# Patient Record
Sex: Male | Born: 1950
Health system: Southern US, Community
[De-identification: ages and names within clinical notes are randomized; demographics above are authoritative.]

## PROBLEM LIST (undated history)

## (undated) DIAGNOSIS — M5430 Sciatica, unspecified side: Secondary | ICD-10-CM

## (undated) DIAGNOSIS — I10 Essential (primary) hypertension: Secondary | ICD-10-CM

## (undated) DIAGNOSIS — C9110 Chronic lymphocytic leukemia of B-cell type not having achieved remission: Secondary | ICD-10-CM

## (undated) HISTORY — PX: TONSILLECTOMY: SUR1361

## (undated) HISTORY — DX: Chronic lymphocytic leukemia of B-cell type not having achieved remission: C91.10

## (undated) HISTORY — DX: Sciatica, unspecified side: M54.30

## (undated) HISTORY — DX: Essential (primary) hypertension: I10

---

## 2010-07-26 ENCOUNTER — Other Ambulatory Visit
Admission: RE | Admit: 2010-07-26 | Discharge: 2010-07-26 | Payer: Self-pay | Source: Home / Self Care | Admitting: Oncology

## 2014-07-26 ENCOUNTER — Other Ambulatory Visit (HOSPITAL_COMMUNITY)
Admission: RE | Admit: 2014-07-26 | Discharge: 2014-07-26 | Disposition: A | Discharge status: Home / Self Care | Payer: Medicare Other | Source: Ambulatory Visit | Attending: Oncology | Admitting: Oncology

## 2014-07-26 DIAGNOSIS — C61 Malignant neoplasm of prostate: Secondary | ICD-10-CM | POA: Insufficient documentation

## 2014-08-03 LAB — TISSUE HYBRIDIZATION TO NCBH

## 2015-07-21 DIAGNOSIS — C9110 Chronic lymphocytic leukemia of B-cell type not having achieved remission: Secondary | ICD-10-CM | POA: Diagnosis not present

## 2015-11-06 ENCOUNTER — Other Ambulatory Visit: Payer: Self-pay | Admitting: Orthopaedic Surgery

## 2015-11-06 DIAGNOSIS — M5416 Radiculopathy, lumbar region: Secondary | ICD-10-CM

## 2015-11-09 ENCOUNTER — Ambulatory Visit
Admission: RE | Admit: 2015-11-09 | Discharge: 2015-11-09 | Disposition: A | Discharge status: Home / Self Care | Payer: BLUE CROSS/BLUE SHIELD | Source: Ambulatory Visit | Attending: Orthopaedic Surgery | Admitting: Orthopaedic Surgery

## 2015-11-09 DIAGNOSIS — M479 Spondylosis, unspecified: Secondary | ICD-10-CM | POA: Insufficient documentation

## 2015-11-09 DIAGNOSIS — M5416 Radiculopathy, lumbar region: Secondary | ICD-10-CM | POA: Insufficient documentation

## 2015-11-09 DIAGNOSIS — M5136 Other intervertebral disc degeneration, lumbar region: Secondary | ICD-10-CM | POA: Diagnosis not present

## 2015-12-06 DIAGNOSIS — C9110 Chronic lymphocytic leukemia of B-cell type not having achieved remission: Secondary | ICD-10-CM | POA: Diagnosis not present

## 2016-04-18 DIAGNOSIS — C9110 Chronic lymphocytic leukemia of B-cell type not having achieved remission: Secondary | ICD-10-CM | POA: Diagnosis not present

## 2016-08-19 DIAGNOSIS — C9110 Chronic lymphocytic leukemia of B-cell type not having achieved remission: Secondary | ICD-10-CM | POA: Diagnosis not present

## 2016-08-19 DIAGNOSIS — D509 Iron deficiency anemia, unspecified: Secondary | ICD-10-CM | POA: Diagnosis not present

## 2016-12-17 DIAGNOSIS — C9110 Chronic lymphocytic leukemia of B-cell type not having achieved remission: Secondary | ICD-10-CM | POA: Diagnosis not present

## 2017-04-17 DIAGNOSIS — C9110 Chronic lymphocytic leukemia of B-cell type not having achieved remission: Secondary | ICD-10-CM | POA: Diagnosis not present

## 2017-08-19 DIAGNOSIS — C9110 Chronic lymphocytic leukemia of B-cell type not having achieved remission: Secondary | ICD-10-CM | POA: Diagnosis not present

## 2017-08-19 DIAGNOSIS — D649 Anemia, unspecified: Secondary | ICD-10-CM | POA: Diagnosis not present

## 2018-04-01 DIAGNOSIS — M545 Low back pain: Secondary | ICD-10-CM | POA: Diagnosis not present

## 2018-04-01 DIAGNOSIS — M48061 Spinal stenosis, lumbar region without neurogenic claudication: Secondary | ICD-10-CM | POA: Diagnosis not present

## 2019-03-25 DIAGNOSIS — M545 Low back pain: Secondary | ICD-10-CM | POA: Diagnosis not present

## 2019-03-25 DIAGNOSIS — M48061 Spinal stenosis, lumbar region without neurogenic claudication: Secondary | ICD-10-CM | POA: Diagnosis not present

## 2019-12-08 DIAGNOSIS — M48061 Spinal stenosis, lumbar region without neurogenic claudication: Secondary | ICD-10-CM | POA: Diagnosis not present

## 2019-12-08 DIAGNOSIS — M545 Low back pain: Secondary | ICD-10-CM | POA: Diagnosis not present

## 2020-06-05 DIAGNOSIS — D485 Neoplasm of uncertain behavior of skin: Secondary | ICD-10-CM | POA: Diagnosis not present

## 2020-06-06 DIAGNOSIS — D044 Carcinoma in situ of skin of scalp and neck: Secondary | ICD-10-CM | POA: Diagnosis not present

## 2020-06-06 DIAGNOSIS — L820 Inflamed seborrheic keratosis: Secondary | ICD-10-CM | POA: Diagnosis not present

## 2020-06-06 DIAGNOSIS — C44602 Unspecified malignant neoplasm of skin of right upper limb, including shoulder: Secondary | ICD-10-CM | POA: Diagnosis not present

## 2020-06-15 ENCOUNTER — Telehealth: Payer: Self-pay

## 2020-06-15 NOTE — Telephone Encounter (Signed)
Pt called to ask for an appt with Dr Bobby Rumpf. He states that he has recently had a biopsy done on skin lesion on his back. Dermatologist told him they would like for him to see an oncologist and get PET ordered.

## 2020-06-16 ENCOUNTER — Inpatient Hospital Stay: Payer: Medicare HMO | Attending: Internal Medicine | Admitting: Internal Medicine

## 2020-06-16 ENCOUNTER — Other Ambulatory Visit: Payer: Self-pay

## 2020-06-16 ENCOUNTER — Inpatient Hospital Stay: Payer: Medicare HMO

## 2020-06-16 ENCOUNTER — Encounter (INDEPENDENT_AMBULATORY_CARE_PROVIDER_SITE_OTHER): Payer: Self-pay

## 2020-06-16 ENCOUNTER — Encounter: Payer: Self-pay | Admitting: Internal Medicine

## 2020-06-16 DIAGNOSIS — D044 Carcinoma in situ of skin of scalp and neck: Secondary | ICD-10-CM | POA: Diagnosis not present

## 2020-06-16 DIAGNOSIS — Z800 Family history of malignant neoplasm of digestive organs: Secondary | ICD-10-CM | POA: Insufficient documentation

## 2020-06-16 DIAGNOSIS — C801 Malignant (primary) neoplasm, unspecified: Secondary | ICD-10-CM

## 2020-06-16 DIAGNOSIS — Z8042 Family history of malignant neoplasm of prostate: Secondary | ICD-10-CM | POA: Insufficient documentation

## 2020-06-16 DIAGNOSIS — C9111 Chronic lymphocytic leukemia of B-cell type in remission: Secondary | ICD-10-CM

## 2020-06-16 DIAGNOSIS — R7989 Other specified abnormal findings of blood chemistry: Secondary | ICD-10-CM | POA: Insufficient documentation

## 2020-06-16 DIAGNOSIS — C792 Secondary malignant neoplasm of skin: Secondary | ICD-10-CM | POA: Diagnosis not present

## 2020-06-16 DIAGNOSIS — R590 Localized enlarged lymph nodes: Secondary | ICD-10-CM | POA: Insufficient documentation

## 2020-06-16 DIAGNOSIS — Z803 Family history of malignant neoplasm of breast: Secondary | ICD-10-CM | POA: Insufficient documentation

## 2020-06-16 DIAGNOSIS — L218 Other seborrheic dermatitis: Secondary | ICD-10-CM | POA: Diagnosis not present

## 2020-06-16 DIAGNOSIS — I10 Essential (primary) hypertension: Secondary | ICD-10-CM | POA: Insufficient documentation

## 2020-06-16 LAB — COMPREHENSIVE METABOLIC PANEL
ALT: 11 U/L (ref 0–44)
AST: 22 U/L (ref 15–41)
Albumin: 4.3 g/dL (ref 3.5–5.0)
Alkaline Phosphatase: 62 U/L (ref 38–126)
Anion gap: 9 (ref 5–15)
BUN: 16 mg/dL (ref 8–23)
CO2: 28 mmol/L (ref 22–32)
Calcium: 9 mg/dL (ref 8.9–10.3)
Chloride: 98 mmol/L (ref 98–111)
Creatinine, Ser: 1.14 mg/dL (ref 0.61–1.24)
GFR, Estimated: >60 mL/min (ref >60)
Glucose, Bld: 103 mg/dL — ABNORMAL HIGH (ref 70–99)
Potassium: 4.9 mmol/L (ref 3.5–5.1)
Sodium: 135 mmol/L (ref 135–145)
Total Bilirubin: 0.9 mg/dL (ref 0.3–1.2)
Total Protein: 6.3 g/dL — ABNORMAL LOW (ref 6.5–8.1)

## 2020-06-16 LAB — CBC WITH DIFFERENTIAL/PLATELET
Abs Immature Granulocytes: 0.28 10*3/uL — ABNORMAL HIGH (ref 0.00–0.07)
Basophils Absolute: 0.2 10*3/uL — ABNORMAL HIGH (ref 0.0–0.1)
Basophils Relative: 0 %
Eosinophils Absolute: 0.3 10*3/uL (ref 0.0–0.5)
Eosinophils Relative: 0 %
HCT: 34.9 % — ABNORMAL LOW (ref 39.0–52.0)
Hemoglobin: 10.6 g/dL — ABNORMAL LOW (ref 13.0–17.0)
Immature Granulocytes: 0 %
Lymphocytes Relative: 95 %
Lymphs Abs: 136.5 10*3/uL — ABNORMAL HIGH (ref 0.7–4.0)
MCH: 28.6 pg (ref 26.0–34.0)
MCHC: 30.4 g/dL (ref 30.0–36.0)
MCV: 94.1 fL (ref 80.0–100.0)
Monocytes Absolute: 3.4 10*3/uL — ABNORMAL HIGH (ref 0.1–1.0)
Monocytes Relative: 2 %
Neutro Abs: 4.8 10*3/uL (ref 1.7–7.7)
Neutrophils Relative %: 3 %
Platelets: 116 10*3/uL — ABNORMAL LOW (ref 150–400)
RBC: 3.71 MIL/uL — ABNORMAL LOW (ref 4.22–5.81)
RDW: 14.5 % (ref 11.5–15.5)
Smear Review: DECREASED
WBC: 145.3 10*3/uL (ref 4.0–10.5)
nRBC: 0 % (ref 0.0–0.2)

## 2020-06-16 LAB — PSA: Prostatic Specific Antigen: 1.24 ng/mL (ref 0.00–4.00)

## 2020-06-16 LAB — APTT: aPTT: 27 seconds (ref 24–36)

## 2020-06-16 LAB — PROTIME-INR
INR: 1.1 (ref 0.8–1.2)
Prothrombin Time: 13.8 seconds (ref 11.4–15.2)

## 2020-06-16 LAB — HEPATITIS B SURFACE ANTIGEN: Hepatitis B Surface Ag: NONREACTIVE

## 2020-06-16 LAB — LACTATE DEHYDROGENASE: LDH: 133 U/L (ref 98–192)

## 2020-06-16 NOTE — Progress Notes (Signed)
[place] NOTE  Patient Care Team: Helen Hashimoto., MD as PCP - General (Internal Medicine)  CHIEF COMPLAINTS/PURPOSE OF CONSULTATION: carcinoma of right posterior shoulder  #  Oncology History Overview Note  # DEC 2021-RIGHT POSTERIOR SHOULDER- Bx [Dr.Dasher]- POORLY DIFFERENTIATED CARCINOMA of UNKNOWN PRIMARY  #DEC 2021-Scalp-skin biopsy squamous cell carcinoma in situ; incomplete resection-  # CLL [2011; Dr.Lewis; [place]]- Surveillaince  # daughter [name]; breast cancer]; seborrheic dermatitis.  # # NGS/MOLECULAR TESTS:    # PALLIATIVE CARE EVALUATION:  # PAIN MANAGEMENT:    DIAGNOSIS:   STAGE:         ;  GOALS:  CURRENT/MOST RECENT THERAPY :     Provisional carcinoma of unknown primary site ([place])  06/16/2020 Initial Diagnosis   Provisional carcinoma of unknown primary site [facility])      HISTORY OF PRESENTING ILLNESS:  Brian Cabrera 69 y.o.  male history of CLL diagnosed approximate 10 years ago-currently on surveillance has been referred to [place] for new diagnosis of carcinoma of his right shoulder.  Patient states that he noted to have a lump right posterior shoulder approximately 6 to 8 months ago.  Slowly getting bigger in size.  Noted to have intermittent bleeding especially with taking of the bandage.  No significant pain.  He finally ended up seeing dermatology few days ago when he had a biopsy of the lesion; along with biopsy of 2 other areas on his scalp.   With regards to CLL patient has lymphadenopathy in his neck underarms-which does not seem to be getting worsening.   Patient denies any weight loss.  Denies any night sweats.  Denies any chest pain or shortness of breath or cough.  No bone pain.   Review of Systems  Constitutional: Negative for chills, diaphoresis, fever, malaise/fatigue and weight loss.  HENT: Negative for nosebleeds and sore throat.   Eyes: Negative for double vision.  Respiratory: Negative for  cough, hemoptysis, sputum production, shortness of breath and wheezing.   Cardiovascular: Negative for chest pain, palpitations, orthopnea and leg swelling.  Gastrointestinal: Negative for abdominal pain, blood in stool, constipation, diarrhea, heartburn, melena, nausea and vomiting.  Genitourinary: Negative for dysuria, frequency and urgency.  Musculoskeletal: Negative for back pain and joint pain.  Skin: Negative.  Negative for itching and rash.  Neurological: Negative for dizziness, tingling, focal weakness, weakness and headaches.  Endo/Heme/Allergies: Does not bruise/bleed easily.  Psychiatric/Behavioral: Negative for depression. The patient is not nervous/anxious and does not have insomnia.      MEDICAL HISTORY:  Past Medical History:  Diagnosis Date  . CLL (chronic lymphocytic leukemia) ([place])   . Hypertension   . Sciatic leg pain     SURGICAL HISTORY: Past Surgical History:  Procedure Laterality Date  . TONSILLECTOMY      SOCIAL HISTORY: Social History   Socioeconomic History  . Marital status: Married    Spouse name: Not on file  . Number of children: Not on file  . Years of education: Not on file  . Highest education level: Not on file  Occupational History  . Not on file  Tobacco Use  . Smoking status: Never Smoker  . Smokeless tobacco: Never Used  Vaping Use  . Vaping Use: Never used  Substance and Sexual Activity  . Alcohol use: Not Currently  . Drug use: Never  . Sexual activity: Yes  Other Topics Concern  . Not on file  Social History Narrative   [place]; no  smoking; no alcohol. 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    Social Determinants of Health   Financial Resource Strain: Not on file  Food Insecurity: Not on file  Transportation Needs: Not on file  Physical Activity: Not on file  Stress: Not on file  Social Connections: Not on file  Intimate Partner Violence: Not on file     FAMILY HISTORY: Family History  Problem Relation Age of Onset  . Colon cancer Mother   . Prostate cancer Father   . Breast cancer Daughter        braca 2 mutation    ALLERGIES:  has No Known Allergies.  MEDICATIONS:  Current Outpatient Medications  Medication Sig Dispense Refill  . Ascorbic Acid (C 500/ROSE HIPS PO) Take by mouth.    . Ascorbic Acid (VITAMIN C) 1000 MG tablet Take 1,000 mg by mouth daily.    . Calcium Carbonate Antacid (CALCIUM CARBONATE PO) Take 1 tablet by mouth daily.    . calcium-vitamin D (OSCAL WITH D) 250-125 MG-UNIT tablet Take 1 tablet by mouth daily.    . Cyanocobalamin (B-12) 5000 MCG CAPS Take by mouth.    . ELDERBERRY PO Take 50 mg by mouth daily.    . ferrous sulfate 325 (65 FE) MG tablet Take 325 mg by mouth daily with breakfast.    . gabapentin (NEURONTIN) 300 MG capsule Take 1 capsule by mouth 2 (two) times daily.    . Inositol Niacinate 750 MG CAPS Take 750 mg by mouth daily.    Marland Kitchen losartan-hydrochlorothiazide (HYZAAR) 100-25 MG tablet Take 1 tablet by mouth daily.    . magnesium gluconate (MAGONATE) 500 MG tablet Take 500 mg by mouth daily.    . NON FORMULARY BioComplete3- takes 1 tablet daily    . Nutritional Supplements (JUICE PLUS FIBRE PO) Take by mouth.    . Turmeric (QC TUMERIC COMPLEX) 500 MG CAPS Take 1,000 mg by mouth daily.    Marland Kitchen zinc gluconate 50 MG tablet Take 50 mg by mouth daily.     No current facility-administered medications for this visit.      Marland Kitchen  PHYSICAL EXAMINATION: ECOG PERFORMANCE STATUS: 1 - Symptomatic but completely ambulatory  Vitals:   06/16/20 1513  BP: (!) 146/55  Pulse: 63  Temp: 98.4 F (36.9 C)  SpO2: 100%   Filed Weights   06/16/20 1513  Weight: 195 lb 8 oz (88.7 kg)    Physical Exam Constitutional:      Comments: Accompanied by his wife.  Daughter on the phone.  Ambulating independently.  HENT:     Head: Normocephalic and atraumatic.     Mouth/Throat:     Mouth: Oropharynx is clear and  moist.     Pharynx: No oropharyngeal exudate.  Eyes:     Pupils: Pupils are equal, round, and reactive to light.  Neck:     Comments: Bilateral neck adenopathy rubbery.  Underarm lymph nodes bilateral. Cardiovascular:     Rate and Rhythm: Normal rate and regular rhythm.  Pulmonary:     Effort: Pulmonary effort is normal. No respiratory distress.     Breath sounds: Normal breath sounds. No wheezing.  Abdominal:     General: Bowel sounds are normal. There is no distension.     Palpations: Abdomen is soft. There is no mass.     Tenderness: There is no abdominal tenderness. There is no guarding or rebound.  Musculoskeletal:        General:  No tenderness or edema. Normal range of motion.     Cervical back: Normal range of motion and neck supple.  Skin:    General: Skin is warm.     Comments: Polypoid at least 2 x 2 inches lesion noted in the right scapular region.  Erythematous.   Seborrheic dermatitis of the face scalp; squamous cell carcinoma of the scalp.   Neurological:     Mental Status: He is alert and oriented to person, place, and time.  Psychiatric:        Mood and Affect: Affect normal.        LABORATORY DATA:  I have reviewed the data as listed No results found for: WBC, HGB, HCT, MCV, PLT No results for input(s): NA, K, CL, CO2, GLUCOSE, BUN, CREATININE, CALCIUM, GFRNONAA, GFRAA, PROT, ALBUMIN, AST, ALT, ALKPHOS, BILITOT, BILIDIR, IBILI in the last 8760 hours.  RADIOGRAPHIC STUDIES: I have personally reviewed the radiological images as listed and agreed with the findings in the report. No results found.  ASSESSMENT & PLAN:   Provisional carcinoma of unknown primary site [facility]) # right shoulder polypoid skin lesion biopsy-poorly differentiated based on IHC-possible metastatic disease.  Clinically unclear primary site of origin.  Recommend PET scan for further evaluation ASAP [insurance-CT scan].   # CLL-more than 10 years ago diagnosis currently on surveillance.   No clinical symptoms suggestive of any need for any therapies.  Discussed that in general patients with CLL at a high risk for secondary malignancies.  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   # BRCA- 2- daughter diagnosed with breast cancer.  Would be important for patient to be worked up/genetic counseling especially context of above malignancy.  #We will check labs today CBC CMP LDH flow cytometry/CEA PSA hepatitis B surface antigen.  2 microglobulin.  Thank you Dr.Dasher for allowing me to participate in the care of your pleasant patient. Please do not hesitate to contact me with questions or concerns in the interim.  The above plan of care was discussed with the patient/wife and daughter over the phone in detail.  They understand the patient based upon further work-up might end up needing biopsy/resection of the shoulder lesion.  However await above work-up for now.  # DISPOSITION: # PET scan ASAP  # referral to Dr.Byrnett re: lump/ cancer right posterior shoulder # labs today- cbc/cmp/ldh/flowcytometry; PSA; CEA # follow up TBD- Dr.B    All questions were answered. The patient knows to call the clinic with any problems, questions or concerns.    Cammie Sickle, MD 06/16/2020 4:49 PM

## 2020-06-16 NOTE — Assessment & Plan Note (Addendum)
#  right shoulder polypoid skin lesion biopsy-poorly differentiated based on IHC-possible metastatic disease.  Clinically unclear primary site of origin.  Recommend PET scan for further evaluation ASAP [insurance-CT scan].   # CLL-more than 10 years ago diagnosis currently on surveillance.  No clinical symptoms suggestive of any need for any therapies.  Discussed that in general patients with CLL at a high risk for secondary malignancies.  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   # BRCA- 2- daughter diagnosed with breast cancer.  Would be important for patient to be worked up/genetic counseling especially context of above malignancy.  #We will check labs today CBC CMP LDH flow cytometry/CEA PSA hepatitis B surface antigen.  2 microglobulin.  Thank you Dr.Dasher for allowing me to participate in the care of your pleasant patient. Please do not hesitate to contact me with questions or concerns in the interim.  The above plan of care was discussed with the patient/wife and daughter over the phone in detail.  They understand the patient based upon further work-up might end up needing biopsy/resection of the shoulder lesion.  However await above work-up for now.  # DISPOSITION: # PET scan ASAP  # referral to Dr.Byrnett re: lump/ cancer right posterior shoulder # labs today- cbc/cmp/ldh/flowcytometry; PSA; CEA # follow up TBD- Dr.B

## 2020-06-17 LAB — CEA: CEA: 1.2 ng/mL (ref 0.0–4.7)

## 2020-06-19 LAB — BETA 2 MICROGLOBULIN, SERUM: Beta-2 Microglobulin: 3.1 mg/L — ABNORMAL HIGH (ref 0.6–2.4)

## 2020-06-19 LAB — PATHOLOGIST SMEAR REVIEW

## 2020-06-20 LAB — COMP PANEL: LEUKEMIA/LYMPHOMA: Immunophenotypic Profile: 95

## 2020-06-22 ENCOUNTER — Telehealth: Payer: Self-pay | Admitting: *Deleted

## 2020-06-22 NOTE — Telephone Encounter (Signed)
Patient waiting to hear about PET scan appointment, has it been approved yet?

## 2020-06-23 NOTE — Telephone Encounter (Signed)
please schedule ASAP; follow up with me or X- MD 1-2 days post PET- per Dr. Rogue Bussing

## 2020-06-23 NOTE — Telephone Encounter (Signed)
Per message from [place] - [facility] [employer] Help Auth# [id number] valid 06/20/20-07/20/20 for [id number]

## 2020-06-26 ENCOUNTER — Telehealth: Payer: Self-pay | Admitting: Internal Medicine

## 2020-06-26 NOTE — Telephone Encounter (Signed)
On 12/17-spoke to patient and wife regarding upcoming PET scan; also follow-up with me.  Also reviewed the blood work; recommend bringing the blood work/records from previous office visits with Dr. Bobby Rumpf.

## 2020-06-27 ENCOUNTER — Other Ambulatory Visit: Payer: Self-pay

## 2020-06-27 ENCOUNTER — Encounter
Admission: RE | Admit: 2020-06-27 | Discharge: 2020-06-27 | Disposition: A | Discharge status: Home / Self Care | Payer: Medicare HMO | Source: Ambulatory Visit | Attending: Internal Medicine | Admitting: Internal Medicine

## 2020-06-27 DIAGNOSIS — R222 Localized swelling, mass and lump, trunk: Secondary | ICD-10-CM | POA: Diagnosis not present

## 2020-06-27 DIAGNOSIS — C4490 Unspecified malignant neoplasm of skin, unspecified: Secondary | ICD-10-CM | POA: Diagnosis not present

## 2020-06-27 DIAGNOSIS — R161 Splenomegaly, not elsewhere classified: Secondary | ICD-10-CM | POA: Insufficient documentation

## 2020-06-27 DIAGNOSIS — C801 Malignant (primary) neoplasm, unspecified: Secondary | ICD-10-CM

## 2020-06-27 DIAGNOSIS — D490 Neoplasm of unspecified behavior of digestive system: Secondary | ICD-10-CM | POA: Diagnosis not present

## 2020-06-27 LAB — GLUCOSE, CAPILLARY: Glucose-Capillary: 87 mg/dL (ref 70–99)

## 2020-06-27 MED ORDER — FLUDEOXYGLUCOSE F - 18 (FDG) INJECTION
9.7200 | Freq: Once | INTRAVENOUS | Status: AC | PRN
  Administered 2020-06-27: 9.72 via INTRAVENOUS

## 2020-06-28 ENCOUNTER — Inpatient Hospital Stay: Payer: Medicare HMO | Admitting: Internal Medicine

## 2020-06-28 ENCOUNTER — Encounter: Payer: Self-pay | Admitting: Internal Medicine

## 2020-06-28 ENCOUNTER — Other Ambulatory Visit: Payer: Self-pay | Admitting: Internal Medicine

## 2020-06-28 VITALS — BP 126/59 | HR 72 | Temp 97.7°F | Resp 16 | Ht 72.0 in | Wt 194.2 lb

## 2020-06-28 DIAGNOSIS — Z803 Family history of malignant neoplasm of breast: Secondary | ICD-10-CM | POA: Diagnosis not present

## 2020-06-28 DIAGNOSIS — C07 Malignant neoplasm of parotid gland: Secondary | ICD-10-CM | POA: Diagnosis not present

## 2020-06-28 DIAGNOSIS — L218 Other seborrheic dermatitis: Secondary | ICD-10-CM | POA: Diagnosis not present

## 2020-06-28 DIAGNOSIS — C801 Malignant (primary) neoplasm, unspecified: Secondary | ICD-10-CM | POA: Diagnosis not present

## 2020-06-28 DIAGNOSIS — C792 Secondary malignant neoplasm of skin: Secondary | ICD-10-CM | POA: Diagnosis not present

## 2020-06-28 DIAGNOSIS — Z8042 Family history of malignant neoplasm of prostate: Secondary | ICD-10-CM | POA: Diagnosis not present

## 2020-06-28 DIAGNOSIS — R590 Localized enlarged lymph nodes: Secondary | ICD-10-CM | POA: Diagnosis not present

## 2020-06-28 DIAGNOSIS — D044 Carcinoma in situ of skin of scalp and neck: Secondary | ICD-10-CM | POA: Diagnosis not present

## 2020-06-28 DIAGNOSIS — C9111 Chronic lymphocytic leukemia of B-cell type in remission: Secondary | ICD-10-CM | POA: Diagnosis not present

## 2020-06-28 DIAGNOSIS — R7989 Other specified abnormal findings of blood chemistry: Secondary | ICD-10-CM | POA: Diagnosis not present

## 2020-06-28 DIAGNOSIS — Z800 Family history of malignant neoplasm of digestive organs: Secondary | ICD-10-CM | POA: Diagnosis not present

## 2020-06-28 NOTE — Progress Notes (Signed)
[place] NOTE  Patient Care Team: Helen Hashimoto., MD as PCP - General (Internal Medicine) Clyde Canterbury, MD as Referring Physician (Otolaryngology) Cammie Sickle, MD as Consulting Physician (Internal Medicine)  CHIEF COMPLAINTS/PURPOSE OF CONSULTATION: carcinoma of right posterior shoulder  #  Oncology History Overview Note  # DEC 2021-RIGHT POSTERIOR SHOULDER- Bx [Dr.Dasher]- POORLY DIFFERENTIATED CARCINOMA of UNKNOWN PRIMARY; DEC 16XW PET-hypermetabolic cutaneous exophytic mass right back; hypermetabolic nodule medial aspect of the right parotid gland- ? Primary parotid malignancy; additional non-hypermetabolic bilateral neck adenopathy; abdominal/pelvic adenopathy splenomegaly [Hx of CLL]  #DEC 2021-Scalp-skin biopsy squamous cell carcinoma in situ; incomplete resection-  # CLL [2011; Dr.Lewis; [place]]- Surveillaince  # daughter [name]; breast cancer]; seborrheic dermatitis.  # # NGS/MOLECULAR TESTS:    # PALLIATIVE CARE EVALUATION:  # PAIN MANAGEMENT:    DIAGNOSIS:   STAGE:         ;  GOALS:  CURRENT/MOST RECENT THERAPY :     Provisional carcinoma of unknown primary site ([place])  06/16/2020 Initial Diagnosis   Provisional carcinoma of unknown primary site [facility])      HISTORY OF PRESENTING ILLNESS:  Brian Cabrera 69 y.o.  male history of CLL stage IV currently on surveillance; also poorly differentiated carcinoma-status post right posterior scapular skin lesion biopsy.  Patient is here to review the results of the PET scan.  The interim patient was also evaluated by Dr. Bary Castilla.  Patient scheduled for excision of the polypoid posterior scapular lesion on 12/23.  Patient continues to complain of intermittent bleeding and discomfort from the polypoid lesion.  Otherwise patient denies any new lumps or bumps.  Chronic swelling in the neck expected of his CLL.    Review of Systems  Constitutional: Negative for chills,  diaphoresis, fever, malaise/fatigue and weight loss.  HENT: Negative for nosebleeds and sore throat.   Eyes: Negative for double vision.  Respiratory: Negative for cough, hemoptysis, sputum production, shortness of breath and wheezing.   Cardiovascular: Negative for chest pain, palpitations, orthopnea and leg swelling.  Gastrointestinal: Negative for abdominal pain, blood in stool, constipation, diarrhea, heartburn, melena, nausea and vomiting.  Genitourinary: Negative for dysuria, frequency and urgency.  Musculoskeletal: Negative for back pain and joint pain.  Skin: Negative.  Negative for itching and rash.  Neurological: Negative for dizziness, tingling, focal weakness, weakness and headaches.  Endo/Heme/Allergies: Does not bruise/bleed easily.  Psychiatric/Behavioral: Negative for depression. The patient is not nervous/anxious and does not have insomnia.      MEDICAL HISTORY:  Past Medical History:  Diagnosis Date  . CLL (chronic lymphocytic leukemia) ([place])   . Hypertension   . Sciatic leg pain     SURGICAL HISTORY: Past Surgical History:  Procedure Laterality Date  . TONSILLECTOMY      SOCIAL HISTORY: Social History   Socioeconomic History  . Marital status: Married    Spouse name: Not on file  . Number of children: Not on file  . Years of education: Not on file  . Highest education level: Not on file  Occupational History  . Not on file  Tobacco Use  . Smoking status: Never Smoker  . Smokeless tobacco: Never Used  Vaping Use  . Vaping Use: Never used  Substance and Sexual Activity  . Alcohol use: Not Currently  . Drug use: Never  . Sexual activity: Yes  Other Topics Concern  . Not on file  Social History Narrative   [place]; no smoking; no alcohol. 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    Social Determinants of Health   Financial Resource Strain: Not on file  Food Insecurity: Not on file   Transportation Needs: Not on file  Physical Activity: Not on file  Stress: Not on file  Social Connections: Not on file  Intimate Partner Violence: Not on file    FAMILY HISTORY: Family History  Problem Relation Age of Onset  . Colon cancer Mother   . Prostate cancer Father   . Breast cancer Daughter        braca 2 mutation    ALLERGIES:  has No Known Allergies.  MEDICATIONS:  Current Outpatient Medications  Medication Sig Dispense Refill  . Ascorbic Acid (C 500/ROSE HIPS PO) Take by mouth.    . Ascorbic Acid (VITAMIN C) 1000 MG tablet Take 1,000 mg by mouth daily.    . Calcium Carbonate Antacid (CALCIUM CARBONATE PO) Take 1 tablet by mouth daily.    . calcium-vitamin D (OSCAL WITH D) 250-125 MG-UNIT tablet Take 1 tablet by mouth daily.    . Cyanocobalamin (B-12) 5000 MCG CAPS Take by mouth.    . ELDERBERRY PO Take 50 mg by mouth daily.    . ferrous sulfate 325 (65 FE) MG tablet Take 325 mg by mouth daily with breakfast.    . gabapentin (NEURONTIN) 300 MG capsule Take 1 capsule by mouth 2 (two) times daily.    . Inositol Niacinate 750 MG CAPS Take 750 mg by mouth daily.    Marland Kitchen losartan-hydrochlorothiazide (HYZAAR) 100-25 MG tablet Take 1 tablet by mouth daily.    . magnesium gluconate (MAGONATE) 500 MG tablet Take 500 mg by mouth daily.    . NON FORMULARY BioComplete3- takes 1 tablet daily    . Nutritional Supplements (JUICE PLUS FIBRE PO) Take by mouth.    . Turmeric 500 MG CAPS Take 1,000 mg by mouth daily.    Marland Kitchen zinc gluconate 50 MG tablet Take 50 mg by mouth daily.    Marland Kitchen ibuprofen (ADVIL) 200 MG tablet Take by mouth.     No current facility-administered medications for this visit.      Marland Kitchen  PHYSICAL EXAMINATION: ECOG PERFORMANCE STATUS: 1 - Symptomatic but completely ambulatory  Vitals:   06/28/20 1309  BP: (!) 126/59  Pulse: 72  Resp: 16  Temp: 97.7 F (36.5 C)  SpO2: 97%   Filed Weights   06/28/20 1309  Weight: 194 lb 3.2 oz (88.1 kg)    Physical  Exam Constitutional:      Comments: Accompanied by his wife.  Daughter on the phone.  Ambulating independently.  HENT:     Head: Normocephalic and atraumatic.     Mouth/Throat:     Pharynx: No oropharyngeal exudate.  Eyes:     Pupils: Pupils are equal, round, and reactive to light.  Neck:     Comments: Bilateral neck adenopathy rubbery.  Underarm lymph nodes bilateral. Cardiovascular:     Rate and Rhythm: Normal rate and regular rhythm.  Pulmonary:     Effort: Pulmonary effort is normal. No respiratory distress.     Breath sounds: Normal breath sounds. No wheezing.  Abdominal:     General: Bowel sounds are normal. There is no distension.     Palpations: Abdomen is soft. There is no mass.     Tenderness: There is no abdominal tenderness. There is no guarding or rebound.  Musculoskeletal:        General: No tenderness.  Normal range of motion.     Cervical back: Normal range of motion and neck supple.  Skin:    General: Skin is warm.     Comments: Polypoid at least 2 x 2 inches lesion noted in the right scapular region.  Erythematous.   Seborrheic dermatitis of the face scalp; squamous cell carcinoma of the scalp.   Neurological:     Mental Status: He is alert and oriented to person, place, and time.  Psychiatric:        Mood and Affect: Affect normal.        LABORATORY DATA:  I have reviewed the data as listed Lab Results  Component Value Date   WBC 145.3 (HH) 06/16/2020   HGB 10.6 (L) 06/16/2020   HCT 34.9 (L) 06/16/2020   MCV 94.1 06/16/2020   PLT 116 (L) 06/16/2020   Recent Labs    06/16/20 1610  NA 135  K 4.9  CL 98  CO2 28  GLUCOSE 103*  BUN 16  CREATININE 1.14  CALCIUM 9.0  GFRNONAA >60  PROT 6.3*  ALBUMIN 4.3  AST 22  ALT 11  ALKPHOS 62  BILITOT 0.9    RADIOGRAPHIC STUDIES: I have personally reviewed the radiological images as listed and agreed with the findings in the report. NM PET Image Initial (PI) Skull Base To Thigh  Result Date:  06/28/2020 CLINICAL DATA:  Initial treatment strategy for cutaneous carcinoma lesion. EXAM: NUCLEAR MEDICINE PET SKULL BASE TO THIGH TECHNIQUE: 9.6 mCi F-18 FDG was injected intravenously. Full-ring PET imaging was performed from the skull base to thigh after the radiotracer. CT data was obtained and used for attenuation correction and anatomic localization. Fasting blood glucose: 87 mg/dl COMPARISON:  None. FINDINGS: Mediastinal blood pool activity: SUV max 2.04 Liver activity: SUV max 3.1 NECK: Within the medial aspect of the RIGHT parotid gland there is a 12 mm hypermetabolic nodule with SUV max equal 5.9 on image 296. There is a larger nodule within the RIGHT parotid gland measuring 21 mm more superficial which does not have hypermetabolic activity. Several small nodules within the LEFT parotid gland measuring 5 mm to 12 mm which do not have metabolic activity Bilateral level 2 lymph nodes have low metabolic activity consistent with given history of CLL. For example 16 mm node on image 275 in the LEFT level 2 nodal station with SUV max equal 2.5 Additional submental and posterior triangle lymph nodes with low metabolic activity Incidental CT findings: none CHEST: Within the skin surface of the RIGHT back superficial to the scapula, oblong mass measures 4.2 x 2.0 cm with intense metabolic activity (SUV max equal 8.7). Within the LEFT back at similar level there is a small focus of cutaneous activity with SUV max equal 2.7. No clear lesion on CT portion (image 273). Incidental CT findings: Bilateral enlarged axillary lymph nodes with low metabolic activity consistent with CLL ABDOMEN/PELVIS: Spleen is enlarged with normal metabolic activity. Spleen measures 19 cm in craniocaudad dimension Enlarged external iliac and inguinal nodes with low metabolic activity. No abnormal activity in the pancreas or liver. Incidental CT findings: none SKELETON: No focal hypermetabolic activity to suggest skeletal metastasis.  Incidental CT findings: none IMPRESSION: 1. Hypermetabolic cutaneous mass exophytic from the RIGHT back. 2. Small hypermetabolic cutaneous focus in the LEFT back without clear CT correlation. 3. Hypermetabolic nodule within the medial aspect of the RIGHT parotid gland is favored primary parotid neoplasm. Additional bilateral non hypermetabolic nodules within LEFT and RIGHT parotid gland. Consider ENT  consultation. 4. Multiple enlarged lymph nodes with low metabolic activity consistent with given history of chronic lymphocytic leukemia. Enlarged lymph nodes include neck lymph nodes, axillary nodes, iliac nodes and inguinal nodes. 5. Splenomegaly with low metabolic activity also consistent with chronic lymphocytic leukemia. Electronically Signed   By: Suzy Bouchard M.D.   On: 06/28/2020 09:31    ASSESSMENT & PLAN:   Provisional carcinoma of unknown primary site [facility]) # Right shoulder polypoid skin lesion biopsy-poorly differentiated carcinoma based on IHC-possible metastatic disease. DEC 85IO PET-hypermetabolic cutaneous exophytic mass right back; hypermetabolic nodule medial aspect of the right parotid gland- ? Primary parotid malignancy; additional non-hypermetabolic bilateral neck adenopathy; abdominal/pelvic adenopathy splenomegaly [Hx of CLL]  #Plan proceed with excisional biopsy of the right posterior/scapular polypoid mass on 12/23.  For further tissue.  #Right parotid uptake-clinically less likely to be primary lesion.  Discussed regarding ultrasound-guided biopsy; patient/family-reluctant.  Want to wait for the above work-up.  We will make a referral to ENT.  Discussed with Dr. Richardson Landry.  # CLL-Rai stage IV [bulky adenopathy splenomegaly; WBC 145 hemoglobin 10 platelets 130s]-otherwise asymptomatic clinically.  No obvious need for any therapies at this time.  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   # BRCA-  2- daughter diagnosed with breast cancer.  Would be important for patient to be worked up/genetic counseling especially context of above malignancy.  Await above work-up.  Discussed with patient/wife and daughter in detail.  Multiple questions answered to the best of my knowledge.  Also discussed with Dr. Bary Castilla.   # DISPOSITION: # Referral to Dr.Bennett re: Parotid cancer # follow up TBD- Dr.B  # I reviewed the blood work- with the patient in detail; also reviewed the imaging independently [as summarized above]; and with the patient in detail.      All questions were answered. The patient knows to call the clinic with any problems, questions or concerns.    Cammie Sickle, MD 06/28/2020 10:17 PM

## 2020-06-28 NOTE — Progress Notes (Signed)
mdt  

## 2020-06-28 NOTE — Assessment & Plan Note (Addendum)
#  Right shoulder polypoid skin lesion biopsy-poorly differentiated carcinoma based on IHC-possible metastatic disease. DEC 58PR PET-hypermetabolic cutaneous exophytic mass right back; hypermetabolic nodule medial aspect of the right parotid gland- ? Primary parotid malignancy; additional non-hypermetabolic bilateral neck adenopathy; abdominal/pelvic adenopathy splenomegaly [Hx of CLL]  #Plan proceed with excisional biopsy of the right posterior/scapular polypoid mass on 12/23.  For further tissue.  #Right parotid uptake-clinically less likely to be primary lesion.  Discussed regarding ultrasound-guided biopsy; patient/family-reluctant.  Want to wait for the above work-up.  We will make a referral to ENT.  Discussed with Dr. Richardson Landry.  # CLL-Rai stage IV [bulky adenopathy splenomegaly; WBC 145 hemoglobin 10 platelets 130s]-otherwise asymptomatic clinically.  No obvious need for any therapies at this time.  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   # BRCA- 2- daughter diagnosed with breast cancer.  Would be important for patient to be worked up/genetic counseling especially context of above malignancy.  Await above work-up.  Discussed with patient/wife and daughter in detail.  Multiple questions answered to the best of my knowledge.  Also discussed with Dr. Bary Castilla.   # DISPOSITION: # Referral to Dr.Bennett re: Parotid cancer # follow up TBD- Dr.B  # I reviewed the blood work- with the patient in detail; also reviewed the imaging independently [as summarized above]; and with the patient in detail.

## 2020-06-29 ENCOUNTER — Other Ambulatory Visit: Payer: Self-pay | Admitting: General Surgery

## 2020-06-29 DIAGNOSIS — R222 Localized swelling, mass and lump, trunk: Secondary | ICD-10-CM | POA: Diagnosis not present

## 2020-06-29 DIAGNOSIS — D485 Neoplasm of uncertain behavior of skin: Secondary | ICD-10-CM | POA: Diagnosis not present

## 2020-07-19 DIAGNOSIS — C44692 Other specified malignant neoplasm of skin of right upper limb, including shoulder: Secondary | ICD-10-CM | POA: Diagnosis not present

## 2020-07-21 DIAGNOSIS — D37030 Neoplasm of uncertain behavior of the parotid salivary glands: Secondary | ICD-10-CM | POA: Diagnosis not present

## 2020-07-21 DIAGNOSIS — R591 Generalized enlarged lymph nodes: Secondary | ICD-10-CM | POA: Diagnosis not present

## 2020-07-24 ENCOUNTER — Other Ambulatory Visit: Payer: Self-pay | Admitting: Internal Medicine

## 2020-07-24 LAB — SURGICAL PATHOLOGY

## 2020-07-25 ENCOUNTER — Other Ambulatory Visit: Payer: Self-pay | Admitting: Anatomic Pathology & Clinical Pathology

## 2020-07-25 ENCOUNTER — Telehealth: Payer: Self-pay | Admitting: Internal Medicine

## 2020-07-25 NOTE — Telephone Encounter (Signed)
On 1/17-I called patient and discussed the results of the final pathology of his chest wall lesion-sebaceous carcinoma poorly differentiated.  Will discuss at tumor conference on 1/20.  Also discussed with Dr. Evorn Gong.  C-please schedule virtual visit for 1/21.   FYI- Dr.Byrnett.

## 2020-07-27 ENCOUNTER — Other Ambulatory Visit: Payer: Self-pay | Admitting: Otolaryngology

## 2020-07-27 DIAGNOSIS — K118 Other diseases of salivary glands: Secondary | ICD-10-CM

## 2020-07-28 ENCOUNTER — Inpatient Hospital Stay: Payer: Medicare HMO | Attending: Internal Medicine | Admitting: Internal Medicine

## 2020-07-28 ENCOUNTER — Encounter: Payer: Self-pay | Admitting: Internal Medicine

## 2020-07-28 DIAGNOSIS — C801 Malignant (primary) neoplasm, unspecified: Secondary | ICD-10-CM

## 2020-07-28 NOTE — Assessment & Plan Note (Signed)
#  Right shoulder polypoid skin lesion-s/p excision.  Pathology-sebaceous carcinoma.  Margins negative.  Reviewed at [facility] dermatopathology/second opinion.  No evidence of Muir-Torre or syndrome-MSI stable.  In general sebaceous carcinoma rarely metastasized to lymph nodes [see below].  No role for any adjuvant radiation or systemic therapy.  #Right neck parotid uptake-question primary parotid malignancy versus related to CLL; question related to sebaceous carcinoma [clinically less likely]. I have left message; awaiting to discuss with Dr. Pryor Ochoa.   # CLL-Rai stage IV [bulky adenopathy splenomegaly; WBC 145 hemoglobin 10 platelets 130s]-otherwise asymptomatic clinically.  We will need to monitor closely.  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 Evorn Gong dermatology.  # BRCA- 2- daughter diagnosed with breast cancer-    # DISPOSITION: # follow up in 3 months- MD; labs- cbc/cmp;LDH-Dr.B

## 2020-07-28 NOTE — Progress Notes (Signed)
I connected with Brian Cabrera on 07/28/20 at  2:30 PM EST by video enabled telemedicine visit and verified that I am speaking with the correct person using two identifiers.  I discussed the limitations, risks, security and privacy concerns of performing an evaluation and management service by telemedicine and the availability of in-person appointments. I also discussed with the patient that there may be a patient responsible charge related to this service. The patient expressed understanding and agreed to proceed.    Other persons participating in the visit and their role in the encounter: RN/medical reconciliation Patients location: home Providers location: office  Oncology History Overview Note  # DEC 2021-RIGHT POSTERIOR SHOULDER- Bx [Dr.Dasher]- POORLY DIFFERENTIATED CARCINOMA of UNKNOWN PRIMARY; DEC 00FV PET-hypermetabolic cutaneous exophytic mass right back; hypermetabolic nodule medial aspect of the right parotid gland- ? Primary parotid malignancy; additional non-hypermetabolic bilateral neck adenopathy; abdominal/pelvic adenopathy splenomegaly [Hx of CLL]-This case was sent for expert consultation at the [facility] of Medicine  Department of Dermatopathology by Dr. Verdene Rio.  The results of this  consultation are reflected in the diagnosis below.   - Poorly differentiated sebaceous carcinoma.  - Size: At least 3 cm  - Histologic grade: 4/4  - Anatomic level: IV  - Thickness: 15 mm  - Perineural invasion: None  - Vascular/lymphatic invasion: None identified  - Actinic keratosis.  - Atypical small cell lymphocytic infiltrate in reticular dermis,  consistent with history of chronic lymphocytic leukemia.  - Margins: The margins appear free of tumor.   Comment:  The polypoid configuration of the tumor with an epidermal collarette and  ulceration favors a primary cutaneous neoplasm.  The groups of large  carcinoma cells show a little differentiation in the HE sections.   However, the multinodular pattern with areas of central necrosis  resemble sebaceous carcinoma.  The differential diagnosis includes an  undifferentiated cutaneous squamous cell carcinoma and cutaneous  lymphoepithelial like carcinoma.  The widespread staining for epithelial  membrane antigen and Ber-EP4 is characteristic of sebaceous carcinoma.  The adipophilin performed in our laboratory shows neoplastic cells with  tiny vesicles that are positive, also evidence of sebaceous  differentiation.  This tumor is negative for androgen receptor but  retained positivity for MLH1, PMS2, MSH2 and MSH6.  This carcinoma is  unlikely to be related to Muir-Torre syndrome.   The deeper aspects of carcinoma shows syncytial groupings of anaplastic  cells that are intimately surrounded by dense lymphoid aggregates.  These areas resemble lymphoepithelial like carcinoma.  However, large  areas of the tumor do not have the lymphoid infiltration expected with  lymphoepithelial like carcinoma.  The clinical presentation is also  unlike lymphoepithelial like carcinoma.  The tumor shows striking tumor  infiltrating lymphocytes.  The aggregates of small lymphocytes  peripheral and deep to the tumor, however, more likely represent the  patient's known CLL.   I do not know if this cutaneous malignancy could be related to the  abnormal PET findings in the right parotid gland.  If the clinicians are  convinced that the right parotid gland is neoplasia unrelated to the  CLL, tissue sampling would be indicated.  #DEC 2021-Scalp-skin biopsy squamous cell carcinoma in situ; incomplete resection-  # CLL [2011; Dr.Lewis; [place]]- Surveillaince  # daughter [name]; breast cancer]; seborrheic dermatitis.  # # NGS/MOLECULAR TESTS:    # PALLIATIVE CARE EVALUATION:  # PAIN MANAGEMENT:    DIAGNOSIS:   STAGE:         ;  GOALS:  CURRENT/MOST RECENT THERAPY :     Provisional carcinoma of unknown primary  site [facility])  06/16/2020 Initial Diagnosis   Provisional carcinoma of unknown primary site [facility])    Chief Complaint: Sebaceous carcinoma   History of present illness:Brian Cabrera 70 y.o.  male with history of CLL; and also newly diagnosed skin cancer is here for follow-up.  The interim patient was evaluated by Dr. Bary Castilla had complete excision of his right scapular/chest wall lesion.  Denies any new lumps or bumps.  Observation/objective: No acute distress.  Accompanied by his wife.  Assessment and plan: Provisional carcinoma of unknown primary site [facility]) # Right shoulder polypoid skin lesion-s/p excision.  Pathology-sebaceous carcinoma.  Margins negative.  Reviewed at [facility] dermatopathology/second opinion.  No evidence of Muir-Torre or syndrome-MSI stable.  In general sebaceous carcinoma rarely metastasized to lymph nodes [see below].  No role for any adjuvant radiation or systemic therapy.  #Right neck parotid uptake-question primary parotid malignancy versus related to CLL; question related to sebaceous carcinoma [clinically less likely]. I have left message; awaiting to discuss with Dr. Pryor Ochoa.   # CLL-Rai stage IV [bulky adenopathy splenomegaly; WBC 145 hemoglobin 10 platelets 130s]-otherwise asymptomatic clinically.  We will need to monitor closely.  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 Evorn Gong dermatology.  # BRCA- 2- daughter diagnosed with breast cancer-    # DISPOSITION: # follow up in 3 months- MD; labs- cbc/cmp;LDH-Dr.B    Follow-up instructions:  I discussed the assessment and treatment plan with the patient.  The patient was provided an opportunity to ask questions and all were answered.  The patient agreed with the plan and demonstrated understanding of instructions.  The patient was advised to call back or seek an in person evaluation if the symptoms worsen or if the condition fails to improve as  anticipated.   Dr. Charlaine Dalton [place] at [facility] 07/28/2020 3:43 PM

## 2020-08-02 ENCOUNTER — Telehealth: Payer: Self-pay | Admitting: Internal Medicine

## 2020-08-02 NOTE — Telephone Encounter (Signed)
On 1/27-I called patient to discuss the need to proceed with neck lymph node biopsy as early discussed with Dr.Vaught, ENT.  Heather-please reach out to the patient with the recommendation to proceed with biopsy.  FYI-Dr.Vaught.

## 2020-08-03 ENCOUNTER — Other Ambulatory Visit: Payer: Self-pay | Admitting: Radiology

## 2020-08-03 ENCOUNTER — Telehealth: Payer: Self-pay | Admitting: *Deleted

## 2020-08-03 NOTE — Telephone Encounter (Signed)
Spoke with patient. He is agreeable to biopsy. See separate rn phone note: re: calling patient.

## 2020-08-03 NOTE — Telephone Encounter (Signed)
Spoke with patient. He is aware of the plan of care for Dr. Pryor Ochoa ordering a biopsy. He stated that he left a return message with the nursing dept at [facility], but he has not had a return phone call back. I reviewed with him the instructions:  Please arrive 60 minutes prior to appointment time. Also, please do not eat or drink anything after midnight (6-8 hours NPO) except blood pressure/heart/seizure medications. Please take with just a sip of water  Patient offered to set up his mychart account but he declined at this time.

## 2020-08-04 ENCOUNTER — Ambulatory Visit
Admission: RE | Admit: 2020-08-04 | Discharge: 2020-08-04 | Disposition: A | Discharge status: Home / Self Care | Payer: Medicare HMO | Source: Ambulatory Visit | Attending: Otolaryngology | Admitting: Otolaryngology

## 2020-08-04 ENCOUNTER — Other Ambulatory Visit: Payer: Self-pay

## 2020-08-04 DIAGNOSIS — K118 Other diseases of salivary glands: Secondary | ICD-10-CM | POA: Insufficient documentation

## 2020-08-04 DIAGNOSIS — D37030 Neoplasm of uncertain behavior of the parotid salivary glands: Secondary | ICD-10-CM | POA: Diagnosis not present

## 2020-08-04 MED ORDER — SODIUM CHLORIDE 0.9 % IV SOLN: INTRAVENOUS | Status: DC

## 2020-08-04 NOTE — Discharge Instructions (Signed)

## 2020-08-04 NOTE — OR Nursing (Signed)
PA at bedside. Per pt and PA , okay to tolerate procedure with only local anesthetic. Will hold on IV and medications at  This time.

## 2020-08-04 NOTE — Procedures (Signed)
Interventional Radiology Procedure Note  Procedure: Right parotid mass biopsy   Indication: FDG avid right parotid mass  Findings: Please refer to procedural dictation for full description.  Complications: None  EBL: < 10 mL  Miachel Roux, MD [phone]

## 2020-08-07 LAB — SURGICAL PATHOLOGY

## 2020-08-09 DIAGNOSIS — R591 Generalized enlarged lymph nodes: Secondary | ICD-10-CM | POA: Diagnosis not present

## 2020-08-09 DIAGNOSIS — C44611 Basal cell carcinoma of skin of unspecified upper limb, including shoulder: Secondary | ICD-10-CM | POA: Diagnosis not present

## 2020-08-09 DIAGNOSIS — K118 Other diseases of salivary glands: Secondary | ICD-10-CM | POA: Diagnosis not present

## 2020-08-09 DIAGNOSIS — R590 Localized enlarged lymph nodes: Secondary | ICD-10-CM | POA: Diagnosis not present

## 2020-08-09 DIAGNOSIS — C07 Malignant neoplasm of parotid gland: Secondary | ICD-10-CM | POA: Diagnosis not present

## 2020-08-09 DIAGNOSIS — C44622 Squamous cell carcinoma of skin of right upper limb, including shoulder: Secondary | ICD-10-CM | POA: Diagnosis not present

## 2020-08-09 DIAGNOSIS — E041 Nontoxic single thyroid nodule: Secondary | ICD-10-CM | POA: Diagnosis not present

## 2020-08-09 DIAGNOSIS — L989 Disorder of the skin and subcutaneous tissue, unspecified: Secondary | ICD-10-CM | POA: Diagnosis not present

## 2020-08-12 ENCOUNTER — Telehealth: Payer: Self-pay | Admitting: Internal Medicine

## 2020-08-12 NOTE — Telephone Encounter (Signed)
On 2/4- I called pt and spoke re: results of the neck LN bx- positive for squamous cell ca. Pt awaiting appt with ENT at [facility].  Will also discuss at tumor conference on 2/10. GB

## 2020-08-16 DIAGNOSIS — C799 Secondary malignant neoplasm of unspecified site: Secondary | ICD-10-CM | POA: Diagnosis not present

## 2020-08-16 DIAGNOSIS — R222 Localized swelling, mass and lump, trunk: Secondary | ICD-10-CM | POA: Diagnosis not present

## 2020-08-16 DIAGNOSIS — R590 Localized enlarged lymph nodes: Secondary | ICD-10-CM | POA: Diagnosis not present

## 2020-08-16 DIAGNOSIS — C760 Malignant neoplasm of head, face and neck: Secondary | ICD-10-CM | POA: Diagnosis not present

## 2020-08-16 DIAGNOSIS — K118 Other diseases of salivary glands: Secondary | ICD-10-CM | POA: Diagnosis not present

## 2020-08-16 DIAGNOSIS — R591 Generalized enlarged lymph nodes: Secondary | ICD-10-CM | POA: Diagnosis not present

## 2020-08-16 DIAGNOSIS — C801 Malignant (primary) neoplasm, unspecified: Secondary | ICD-10-CM | POA: Diagnosis not present

## 2020-08-17 ENCOUNTER — Other Ambulatory Visit: Payer: Medicare HMO

## 2020-08-17 NOTE — Progress Notes (Signed)
Tumor Board Documentation  Brian Cabrera was presented by Dr Rogue Bussing at our Tumor Board on 08/17/2020, which included representatives from medical oncology,radiation oncology,internal medicine,navigation,pathology,radiology,surgical,pharmacy,genetics,research,palliative care,pulmonology.  Story currently presents as a current patient,for MDC,for new positive pathology with history of the following treatments: active survellience,surgical intervention(s).  Additionally, we reviewed previous medical and familial history, history of present illness, and recent lab results along with all available histopathologic and imaging studies. The tumor board considered available treatment options and made the following recommendations:   Discuss further options with Dr Evorn Gong, Dermatologist  The following procedures/referrals were also placed: No orders of the defined types were placed in this encounter.   Clinical Trial Status: not discussed   Staging used: To be determined  AJCC Staging:       Group: Squamous Cell Carcinoma of Parotid Glans   National site-specific guidelines   were discussed with respect to the case.  Tumor board is a meeting of clinicians from various specialty areas who evaluate and discuss patients for whom a multidisciplinary approach is being considered. Final determinations in the plan of care are those of the provider(s). The responsibility for follow up of recommendations given during tumor board is that of the provider.   Today's extended care, comprehensive team conference, Brian Cabrera was not present for the discussion and was not examined.   Multidisciplinary Tumor Board is a multidisciplinary case peer review process.  Decisions discussed in the Multidisciplinary Tumor Board reflect the opinions of the specialists present at the conference without having examined the patient.  Ultimately, treatment and diagnostic decisions rest with the primary provider(s) and the  patient.

## 2020-08-21 DIAGNOSIS — L814 Other melanin hyperpigmentation: Secondary | ICD-10-CM | POA: Diagnosis not present

## 2020-08-21 DIAGNOSIS — D492 Neoplasm of unspecified behavior of bone, soft tissue, and skin: Secondary | ICD-10-CM | POA: Diagnosis not present

## 2020-08-21 DIAGNOSIS — Z79899 Other long term (current) drug therapy: Secondary | ICD-10-CM | POA: Diagnosis not present

## 2020-08-21 DIAGNOSIS — D229 Melanocytic nevi, unspecified: Secondary | ICD-10-CM | POA: Diagnosis not present

## 2020-08-21 DIAGNOSIS — L578 Other skin changes due to chronic exposure to nonionizing radiation: Secondary | ICD-10-CM | POA: Diagnosis not present

## 2020-08-21 DIAGNOSIS — Z85828 Personal history of other malignant neoplasm of skin: Secondary | ICD-10-CM | POA: Diagnosis not present

## 2020-08-21 DIAGNOSIS — D3617 Benign neoplasm of peripheral nerves and autonomic nervous system of trunk, unspecified: Secondary | ICD-10-CM | POA: Diagnosis not present

## 2020-08-21 DIAGNOSIS — L570 Actinic keratosis: Secondary | ICD-10-CM | POA: Diagnosis not present

## 2020-08-21 DIAGNOSIS — L820 Inflamed seborrheic keratosis: Secondary | ICD-10-CM | POA: Diagnosis not present

## 2020-08-31 ENCOUNTER — Encounter: Payer: Self-pay | Admitting: Internal Medicine

## 2020-09-18 DIAGNOSIS — E041 Nontoxic single thyroid nodule: Secondary | ICD-10-CM | POA: Diagnosis not present

## 2020-09-18 DIAGNOSIS — C07 Malignant neoplasm of parotid gland: Secondary | ICD-10-CM | POA: Diagnosis not present

## 2020-09-18 DIAGNOSIS — Z01812 Encounter for preprocedural laboratory examination: Secondary | ICD-10-CM | POA: Diagnosis not present

## 2020-09-18 DIAGNOSIS — C801 Malignant (primary) neoplasm, unspecified: Secondary | ICD-10-CM | POA: Diagnosis not present

## 2020-09-18 DIAGNOSIS — D048 Carcinoma in situ of skin of other sites: Secondary | ICD-10-CM | POA: Diagnosis not present

## 2020-09-18 DIAGNOSIS — I10 Essential (primary) hypertension: Secondary | ICD-10-CM | POA: Diagnosis not present

## 2020-09-18 DIAGNOSIS — C4492 Squamous cell carcinoma of skin, unspecified: Secondary | ICD-10-CM | POA: Diagnosis not present

## 2020-09-18 DIAGNOSIS — C44629 Squamous cell carcinoma of skin of left upper limb, including shoulder: Secondary | ICD-10-CM | POA: Diagnosis not present

## 2020-09-18 DIAGNOSIS — C7989 Secondary malignant neoplasm of other specified sites: Secondary | ICD-10-CM | POA: Diagnosis not present

## 2020-09-18 DIAGNOSIS — C9110 Chronic lymphocytic leukemia of B-cell type not having achieved remission: Secondary | ICD-10-CM | POA: Diagnosis not present

## 2020-09-18 DIAGNOSIS — K118 Other diseases of salivary glands: Secondary | ICD-10-CM | POA: Diagnosis not present

## 2020-09-18 DIAGNOSIS — R161 Splenomegaly, not elsewhere classified: Secondary | ICD-10-CM | POA: Diagnosis not present

## 2020-09-18 DIAGNOSIS — M5440 Lumbago with sciatica, unspecified side: Secondary | ICD-10-CM | POA: Diagnosis not present

## 2020-10-10 DIAGNOSIS — D649 Anemia, unspecified: Secondary | ICD-10-CM | POA: Diagnosis not present

## 2020-10-10 DIAGNOSIS — R599 Enlarged lymph nodes, unspecified: Secondary | ICD-10-CM | POA: Diagnosis not present

## 2020-10-10 DIAGNOSIS — L570 Actinic keratosis: Secondary | ICD-10-CM | POA: Diagnosis not present

## 2020-10-10 DIAGNOSIS — Z510 Encounter for antineoplastic radiation therapy: Secondary | ICD-10-CM | POA: Diagnosis not present

## 2020-10-10 DIAGNOSIS — C9110 Chronic lymphocytic leukemia of B-cell type not having achieved remission: Secondary | ICD-10-CM | POA: Diagnosis not present

## 2020-10-10 DIAGNOSIS — C770 Secondary and unspecified malignant neoplasm of lymph nodes of head, face and neck: Secondary | ICD-10-CM | POA: Diagnosis not present

## 2020-10-10 DIAGNOSIS — D492 Neoplasm of unspecified behavior of bone, soft tissue, and skin: Secondary | ICD-10-CM | POA: Diagnosis not present

## 2020-10-10 DIAGNOSIS — Z85828 Personal history of other malignant neoplasm of skin: Secondary | ICD-10-CM | POA: Diagnosis not present

## 2020-10-10 DIAGNOSIS — R161 Splenomegaly, not elsewhere classified: Secondary | ICD-10-CM | POA: Diagnosis not present

## 2020-10-10 DIAGNOSIS — I10 Essential (primary) hypertension: Secondary | ICD-10-CM | POA: Diagnosis not present

## 2020-10-10 DIAGNOSIS — L578 Other skin changes due to chronic exposure to nonionizing radiation: Secondary | ICD-10-CM | POA: Diagnosis not present

## 2020-10-10 DIAGNOSIS — C44329 Squamous cell carcinoma of skin of other parts of face: Secondary | ICD-10-CM | POA: Diagnosis not present

## 2020-10-10 DIAGNOSIS — C07 Malignant neoplasm of parotid gland: Secondary | ICD-10-CM | POA: Diagnosis not present

## 2020-10-17 DIAGNOSIS — Z85828 Personal history of other malignant neoplasm of skin: Secondary | ICD-10-CM | POA: Diagnosis not present

## 2020-10-17 DIAGNOSIS — R161 Splenomegaly, not elsewhere classified: Secondary | ICD-10-CM | POA: Diagnosis not present

## 2020-10-17 DIAGNOSIS — I10 Essential (primary) hypertension: Secondary | ICD-10-CM | POA: Diagnosis not present

## 2020-10-17 DIAGNOSIS — D649 Anemia, unspecified: Secondary | ICD-10-CM | POA: Diagnosis not present

## 2020-10-17 DIAGNOSIS — C770 Secondary and unspecified malignant neoplasm of lymph nodes of head, face and neck: Secondary | ICD-10-CM | POA: Diagnosis not present

## 2020-10-17 DIAGNOSIS — C9110 Chronic lymphocytic leukemia of B-cell type not having achieved remission: Secondary | ICD-10-CM | POA: Diagnosis not present

## 2020-10-17 DIAGNOSIS — Z510 Encounter for antineoplastic radiation therapy: Secondary | ICD-10-CM | POA: Diagnosis not present

## 2020-10-17 DIAGNOSIS — C07 Malignant neoplasm of parotid gland: Secondary | ICD-10-CM | POA: Diagnosis not present

## 2020-10-17 DIAGNOSIS — R599 Enlarged lymph nodes, unspecified: Secondary | ICD-10-CM | POA: Diagnosis not present

## 2020-10-20 DIAGNOSIS — C9110 Chronic lymphocytic leukemia of B-cell type not having achieved remission: Secondary | ICD-10-CM | POA: Diagnosis not present

## 2020-10-20 DIAGNOSIS — Z85828 Personal history of other malignant neoplasm of skin: Secondary | ICD-10-CM | POA: Diagnosis not present

## 2020-10-20 DIAGNOSIS — M519 Unspecified thoracic, thoracolumbar and lumbosacral intervertebral disc disorder: Secondary | ICD-10-CM | POA: Diagnosis not present

## 2020-10-20 DIAGNOSIS — C07 Malignant neoplasm of parotid gland: Secondary | ICD-10-CM | POA: Diagnosis not present

## 2020-10-20 DIAGNOSIS — R599 Enlarged lymph nodes, unspecified: Secondary | ICD-10-CM | POA: Diagnosis not present

## 2020-10-20 DIAGNOSIS — C799 Secondary malignant neoplasm of unspecified site: Secondary | ICD-10-CM | POA: Diagnosis not present

## 2020-10-20 DIAGNOSIS — I10 Essential (primary) hypertension: Secondary | ICD-10-CM | POA: Diagnosis not present

## 2020-10-20 DIAGNOSIS — R161 Splenomegaly, not elsewhere classified: Secondary | ICD-10-CM | POA: Diagnosis not present

## 2020-10-20 DIAGNOSIS — D649 Anemia, unspecified: Secondary | ICD-10-CM | POA: Diagnosis not present

## 2020-10-20 DIAGNOSIS — C44629 Squamous cell carcinoma of skin of left upper limb, including shoulder: Secondary | ICD-10-CM | POA: Diagnosis not present

## 2020-10-23 ENCOUNTER — Telehealth: Payer: Self-pay | Admitting: *Deleted

## 2020-10-23 NOTE — Telephone Encounter (Signed)
FYI....    Pt called to cx his 10/26/20 lab/MD appts... Pt stated that he did not want to R/S appts at this time.Marland Kitchen

## 2020-10-25 DIAGNOSIS — R161 Splenomegaly, not elsewhere classified: Secondary | ICD-10-CM | POA: Diagnosis not present

## 2020-10-25 DIAGNOSIS — Z85828 Personal history of other malignant neoplasm of skin: Secondary | ICD-10-CM | POA: Diagnosis not present

## 2020-10-25 DIAGNOSIS — D649 Anemia, unspecified: Secondary | ICD-10-CM | POA: Diagnosis not present

## 2020-10-25 DIAGNOSIS — C9110 Chronic lymphocytic leukemia of B-cell type not having achieved remission: Secondary | ICD-10-CM | POA: Diagnosis not present

## 2020-10-25 DIAGNOSIS — R599 Enlarged lymph nodes, unspecified: Secondary | ICD-10-CM | POA: Diagnosis not present

## 2020-10-25 DIAGNOSIS — I10 Essential (primary) hypertension: Secondary | ICD-10-CM | POA: Diagnosis not present

## 2020-10-25 DIAGNOSIS — Z510 Encounter for antineoplastic radiation therapy: Secondary | ICD-10-CM | POA: Diagnosis not present

## 2020-10-25 DIAGNOSIS — C07 Malignant neoplasm of parotid gland: Secondary | ICD-10-CM | POA: Diagnosis not present

## 2020-10-25 DIAGNOSIS — C770 Secondary and unspecified malignant neoplasm of lymph nodes of head, face and neck: Secondary | ICD-10-CM | POA: Diagnosis not present

## 2020-10-26 ENCOUNTER — Inpatient Hospital Stay: Payer: Medicare HMO

## 2020-10-26 ENCOUNTER — Inpatient Hospital Stay: Payer: Medicare HMO | Admitting: Internal Medicine

## 2020-10-31 DIAGNOSIS — C770 Secondary and unspecified malignant neoplasm of lymph nodes of head, face and neck: Secondary | ICD-10-CM | POA: Diagnosis not present

## 2020-10-31 DIAGNOSIS — R599 Enlarged lymph nodes, unspecified: Secondary | ICD-10-CM | POA: Diagnosis not present

## 2020-10-31 DIAGNOSIS — C9110 Chronic lymphocytic leukemia of B-cell type not having achieved remission: Secondary | ICD-10-CM | POA: Diagnosis not present

## 2020-10-31 DIAGNOSIS — I10 Essential (primary) hypertension: Secondary | ICD-10-CM | POA: Diagnosis not present

## 2020-10-31 DIAGNOSIS — R161 Splenomegaly, not elsewhere classified: Secondary | ICD-10-CM | POA: Diagnosis not present

## 2020-10-31 DIAGNOSIS — C07 Malignant neoplasm of parotid gland: Secondary | ICD-10-CM | POA: Diagnosis not present

## 2020-10-31 DIAGNOSIS — D649 Anemia, unspecified: Secondary | ICD-10-CM | POA: Diagnosis not present

## 2020-10-31 DIAGNOSIS — Z510 Encounter for antineoplastic radiation therapy: Secondary | ICD-10-CM | POA: Diagnosis not present

## 2020-10-31 DIAGNOSIS — Z85828 Personal history of other malignant neoplasm of skin: Secondary | ICD-10-CM | POA: Diagnosis not present

## 2020-11-01 DIAGNOSIS — Z85828 Personal history of other malignant neoplasm of skin: Secondary | ICD-10-CM | POA: Diagnosis not present

## 2020-11-01 DIAGNOSIS — R161 Splenomegaly, not elsewhere classified: Secondary | ICD-10-CM | POA: Diagnosis not present

## 2020-11-01 DIAGNOSIS — Z510 Encounter for antineoplastic radiation therapy: Secondary | ICD-10-CM | POA: Diagnosis not present

## 2020-11-01 DIAGNOSIS — R599 Enlarged lymph nodes, unspecified: Secondary | ICD-10-CM | POA: Diagnosis not present

## 2020-11-01 DIAGNOSIS — I10 Essential (primary) hypertension: Secondary | ICD-10-CM | POA: Diagnosis not present

## 2020-11-01 DIAGNOSIS — C9110 Chronic lymphocytic leukemia of B-cell type not having achieved remission: Secondary | ICD-10-CM | POA: Diagnosis not present

## 2020-11-01 DIAGNOSIS — C07 Malignant neoplasm of parotid gland: Secondary | ICD-10-CM | POA: Diagnosis not present

## 2020-11-01 DIAGNOSIS — D649 Anemia, unspecified: Secondary | ICD-10-CM | POA: Diagnosis not present

## 2020-11-01 DIAGNOSIS — C770 Secondary and unspecified malignant neoplasm of lymph nodes of head, face and neck: Secondary | ICD-10-CM | POA: Diagnosis not present

## 2020-11-02 DIAGNOSIS — R599 Enlarged lymph nodes, unspecified: Secondary | ICD-10-CM | POA: Diagnosis not present

## 2020-11-02 DIAGNOSIS — I10 Essential (primary) hypertension: Secondary | ICD-10-CM | POA: Diagnosis not present

## 2020-11-02 DIAGNOSIS — C07 Malignant neoplasm of parotid gland: Secondary | ICD-10-CM | POA: Diagnosis not present

## 2020-11-02 DIAGNOSIS — R161 Splenomegaly, not elsewhere classified: Secondary | ICD-10-CM | POA: Diagnosis not present

## 2020-11-02 DIAGNOSIS — D649 Anemia, unspecified: Secondary | ICD-10-CM | POA: Diagnosis not present

## 2020-11-02 DIAGNOSIS — C9110 Chronic lymphocytic leukemia of B-cell type not having achieved remission: Secondary | ICD-10-CM | POA: Diagnosis not present

## 2020-11-02 DIAGNOSIS — Z85828 Personal history of other malignant neoplasm of skin: Secondary | ICD-10-CM | POA: Diagnosis not present

## 2020-11-02 DIAGNOSIS — Z510 Encounter for antineoplastic radiation therapy: Secondary | ICD-10-CM | POA: Diagnosis not present

## 2020-11-02 DIAGNOSIS — C770 Secondary and unspecified malignant neoplasm of lymph nodes of head, face and neck: Secondary | ICD-10-CM | POA: Diagnosis not present

## 2020-11-03 DIAGNOSIS — C9110 Chronic lymphocytic leukemia of B-cell type not having achieved remission: Secondary | ICD-10-CM | POA: Diagnosis not present

## 2020-11-03 DIAGNOSIS — C770 Secondary and unspecified malignant neoplasm of lymph nodes of head, face and neck: Secondary | ICD-10-CM | POA: Diagnosis not present

## 2020-11-03 DIAGNOSIS — C07 Malignant neoplasm of parotid gland: Secondary | ICD-10-CM | POA: Diagnosis not present

## 2020-11-03 DIAGNOSIS — Z85828 Personal history of other malignant neoplasm of skin: Secondary | ICD-10-CM | POA: Diagnosis not present

## 2020-11-03 DIAGNOSIS — R161 Splenomegaly, not elsewhere classified: Secondary | ICD-10-CM | POA: Diagnosis not present

## 2020-11-03 DIAGNOSIS — Z510 Encounter for antineoplastic radiation therapy: Secondary | ICD-10-CM | POA: Diagnosis not present

## 2020-11-03 DIAGNOSIS — D649 Anemia, unspecified: Secondary | ICD-10-CM | POA: Diagnosis not present

## 2020-11-03 DIAGNOSIS — I10 Essential (primary) hypertension: Secondary | ICD-10-CM | POA: Diagnosis not present

## 2020-11-03 DIAGNOSIS — R599 Enlarged lymph nodes, unspecified: Secondary | ICD-10-CM | POA: Diagnosis not present

## 2020-11-06 DIAGNOSIS — R161 Splenomegaly, not elsewhere classified: Secondary | ICD-10-CM | POA: Diagnosis not present

## 2020-11-06 DIAGNOSIS — C9110 Chronic lymphocytic leukemia of B-cell type not having achieved remission: Secondary | ICD-10-CM | POA: Diagnosis not present

## 2020-11-06 DIAGNOSIS — Z85828 Personal history of other malignant neoplasm of skin: Secondary | ICD-10-CM | POA: Diagnosis not present

## 2020-11-06 DIAGNOSIS — D649 Anemia, unspecified: Secondary | ICD-10-CM | POA: Diagnosis not present

## 2020-11-06 DIAGNOSIS — C770 Secondary and unspecified malignant neoplasm of lymph nodes of head, face and neck: Secondary | ICD-10-CM | POA: Diagnosis not present

## 2020-11-06 DIAGNOSIS — R599 Enlarged lymph nodes, unspecified: Secondary | ICD-10-CM | POA: Diagnosis not present

## 2020-11-06 DIAGNOSIS — Z510 Encounter for antineoplastic radiation therapy: Secondary | ICD-10-CM | POA: Diagnosis not present

## 2020-11-06 DIAGNOSIS — C07 Malignant neoplasm of parotid gland: Secondary | ICD-10-CM | POA: Diagnosis not present

## 2020-11-06 DIAGNOSIS — I10 Essential (primary) hypertension: Secondary | ICD-10-CM | POA: Diagnosis not present

## 2020-11-07 DIAGNOSIS — C770 Secondary and unspecified malignant neoplasm of lymph nodes of head, face and neck: Secondary | ICD-10-CM | POA: Diagnosis not present

## 2020-11-07 DIAGNOSIS — R599 Enlarged lymph nodes, unspecified: Secondary | ICD-10-CM | POA: Diagnosis not present

## 2020-11-07 DIAGNOSIS — R161 Splenomegaly, not elsewhere classified: Secondary | ICD-10-CM | POA: Diagnosis not present

## 2020-11-07 DIAGNOSIS — Z510 Encounter for antineoplastic radiation therapy: Secondary | ICD-10-CM | POA: Diagnosis not present

## 2020-11-07 DIAGNOSIS — C07 Malignant neoplasm of parotid gland: Secondary | ICD-10-CM | POA: Diagnosis not present

## 2020-11-07 DIAGNOSIS — D649 Anemia, unspecified: Secondary | ICD-10-CM | POA: Diagnosis not present

## 2020-11-07 DIAGNOSIS — I10 Essential (primary) hypertension: Secondary | ICD-10-CM | POA: Diagnosis not present

## 2020-11-07 DIAGNOSIS — C9110 Chronic lymphocytic leukemia of B-cell type not having achieved remission: Secondary | ICD-10-CM | POA: Diagnosis not present

## 2020-11-07 DIAGNOSIS — Z85828 Personal history of other malignant neoplasm of skin: Secondary | ICD-10-CM | POA: Diagnosis not present

## 2020-11-08 DIAGNOSIS — Z510 Encounter for antineoplastic radiation therapy: Secondary | ICD-10-CM | POA: Diagnosis not present

## 2020-11-08 DIAGNOSIS — C770 Secondary and unspecified malignant neoplasm of lymph nodes of head, face and neck: Secondary | ICD-10-CM | POA: Diagnosis not present

## 2020-11-08 DIAGNOSIS — D649 Anemia, unspecified: Secondary | ICD-10-CM | POA: Diagnosis not present

## 2020-11-08 DIAGNOSIS — C9110 Chronic lymphocytic leukemia of B-cell type not having achieved remission: Secondary | ICD-10-CM | POA: Diagnosis not present

## 2020-11-08 DIAGNOSIS — Z85828 Personal history of other malignant neoplasm of skin: Secondary | ICD-10-CM | POA: Diagnosis not present

## 2020-11-08 DIAGNOSIS — R161 Splenomegaly, not elsewhere classified: Secondary | ICD-10-CM | POA: Diagnosis not present

## 2020-11-08 DIAGNOSIS — I10 Essential (primary) hypertension: Secondary | ICD-10-CM | POA: Diagnosis not present

## 2020-11-08 DIAGNOSIS — R599 Enlarged lymph nodes, unspecified: Secondary | ICD-10-CM | POA: Diagnosis not present

## 2020-11-08 DIAGNOSIS — C07 Malignant neoplasm of parotid gland: Secondary | ICD-10-CM | POA: Diagnosis not present

## 2020-11-09 DIAGNOSIS — Z85828 Personal history of other malignant neoplasm of skin: Secondary | ICD-10-CM | POA: Diagnosis not present

## 2020-11-09 DIAGNOSIS — C770 Secondary and unspecified malignant neoplasm of lymph nodes of head, face and neck: Secondary | ICD-10-CM | POA: Diagnosis not present

## 2020-11-09 DIAGNOSIS — C07 Malignant neoplasm of parotid gland: Secondary | ICD-10-CM | POA: Diagnosis not present

## 2020-11-09 DIAGNOSIS — R599 Enlarged lymph nodes, unspecified: Secondary | ICD-10-CM | POA: Diagnosis not present

## 2020-11-09 DIAGNOSIS — R161 Splenomegaly, not elsewhere classified: Secondary | ICD-10-CM | POA: Diagnosis not present

## 2020-11-09 DIAGNOSIS — Z510 Encounter for antineoplastic radiation therapy: Secondary | ICD-10-CM | POA: Diagnosis not present

## 2020-11-09 DIAGNOSIS — C9110 Chronic lymphocytic leukemia of B-cell type not having achieved remission: Secondary | ICD-10-CM | POA: Diagnosis not present

## 2020-11-09 DIAGNOSIS — D649 Anemia, unspecified: Secondary | ICD-10-CM | POA: Diagnosis not present

## 2020-11-09 DIAGNOSIS — I10 Essential (primary) hypertension: Secondary | ICD-10-CM | POA: Diagnosis not present

## 2020-11-10 DIAGNOSIS — C07 Malignant neoplasm of parotid gland: Secondary | ICD-10-CM | POA: Diagnosis not present

## 2020-11-10 DIAGNOSIS — R161 Splenomegaly, not elsewhere classified: Secondary | ICD-10-CM | POA: Diagnosis not present

## 2020-11-10 DIAGNOSIS — R599 Enlarged lymph nodes, unspecified: Secondary | ICD-10-CM | POA: Diagnosis not present

## 2020-11-10 DIAGNOSIS — D649 Anemia, unspecified: Secondary | ICD-10-CM | POA: Diagnosis not present

## 2020-11-10 DIAGNOSIS — C9110 Chronic lymphocytic leukemia of B-cell type not having achieved remission: Secondary | ICD-10-CM | POA: Diagnosis not present

## 2020-11-10 DIAGNOSIS — C770 Secondary and unspecified malignant neoplasm of lymph nodes of head, face and neck: Secondary | ICD-10-CM | POA: Diagnosis not present

## 2020-11-10 DIAGNOSIS — I10 Essential (primary) hypertension: Secondary | ICD-10-CM | POA: Diagnosis not present

## 2020-11-10 DIAGNOSIS — Z85828 Personal history of other malignant neoplasm of skin: Secondary | ICD-10-CM | POA: Diagnosis not present

## 2020-11-10 DIAGNOSIS — Z510 Encounter for antineoplastic radiation therapy: Secondary | ICD-10-CM | POA: Diagnosis not present

## 2020-11-13 DIAGNOSIS — I10 Essential (primary) hypertension: Secondary | ICD-10-CM | POA: Diagnosis not present

## 2020-11-13 DIAGNOSIS — C9110 Chronic lymphocytic leukemia of B-cell type not having achieved remission: Secondary | ICD-10-CM | POA: Diagnosis not present

## 2020-11-13 DIAGNOSIS — R161 Splenomegaly, not elsewhere classified: Secondary | ICD-10-CM | POA: Diagnosis not present

## 2020-11-13 DIAGNOSIS — Z85828 Personal history of other malignant neoplasm of skin: Secondary | ICD-10-CM | POA: Diagnosis not present

## 2020-11-13 DIAGNOSIS — L578 Other skin changes due to chronic exposure to nonionizing radiation: Secondary | ICD-10-CM | POA: Diagnosis not present

## 2020-11-13 DIAGNOSIS — Z510 Encounter for antineoplastic radiation therapy: Secondary | ICD-10-CM | POA: Diagnosis not present

## 2020-11-13 DIAGNOSIS — C770 Secondary and unspecified malignant neoplasm of lymph nodes of head, face and neck: Secondary | ICD-10-CM | POA: Diagnosis not present

## 2020-11-13 DIAGNOSIS — C07 Malignant neoplasm of parotid gland: Secondary | ICD-10-CM | POA: Diagnosis not present

## 2020-11-13 DIAGNOSIS — C44329 Squamous cell carcinoma of skin of other parts of face: Secondary | ICD-10-CM | POA: Diagnosis not present

## 2020-11-13 DIAGNOSIS — L988 Other specified disorders of the skin and subcutaneous tissue: Secondary | ICD-10-CM | POA: Diagnosis not present

## 2020-11-13 DIAGNOSIS — R599 Enlarged lymph nodes, unspecified: Secondary | ICD-10-CM | POA: Diagnosis not present

## 2020-11-13 DIAGNOSIS — D649 Anemia, unspecified: Secondary | ICD-10-CM | POA: Diagnosis not present

## 2020-11-15 DIAGNOSIS — C07 Malignant neoplasm of parotid gland: Secondary | ICD-10-CM | POA: Diagnosis not present

## 2020-11-15 DIAGNOSIS — R490 Dysphonia: Secondary | ICD-10-CM | POA: Diagnosis not present

## 2020-11-16 DIAGNOSIS — Z5189 Encounter for other specified aftercare: Secondary | ICD-10-CM | POA: Diagnosis not present

## 2020-11-20 DIAGNOSIS — R161 Splenomegaly, not elsewhere classified: Secondary | ICD-10-CM | POA: Diagnosis not present

## 2020-11-20 DIAGNOSIS — D649 Anemia, unspecified: Secondary | ICD-10-CM | POA: Diagnosis not present

## 2020-11-20 DIAGNOSIS — C9110 Chronic lymphocytic leukemia of B-cell type not having achieved remission: Secondary | ICD-10-CM | POA: Diagnosis not present

## 2020-11-20 DIAGNOSIS — Z510 Encounter for antineoplastic radiation therapy: Secondary | ICD-10-CM | POA: Diagnosis not present

## 2020-11-20 DIAGNOSIS — C07 Malignant neoplasm of parotid gland: Secondary | ICD-10-CM | POA: Diagnosis not present

## 2020-11-20 DIAGNOSIS — R599 Enlarged lymph nodes, unspecified: Secondary | ICD-10-CM | POA: Diagnosis not present

## 2020-11-20 DIAGNOSIS — C770 Secondary and unspecified malignant neoplasm of lymph nodes of head, face and neck: Secondary | ICD-10-CM | POA: Diagnosis not present

## 2020-11-20 DIAGNOSIS — Z85828 Personal history of other malignant neoplasm of skin: Secondary | ICD-10-CM | POA: Diagnosis not present

## 2020-11-20 DIAGNOSIS — I10 Essential (primary) hypertension: Secondary | ICD-10-CM | POA: Diagnosis not present

## 2020-11-21 DIAGNOSIS — Z510 Encounter for antineoplastic radiation therapy: Secondary | ICD-10-CM | POA: Diagnosis not present

## 2020-11-21 DIAGNOSIS — C07 Malignant neoplasm of parotid gland: Secondary | ICD-10-CM | POA: Diagnosis not present

## 2020-11-21 DIAGNOSIS — D649 Anemia, unspecified: Secondary | ICD-10-CM | POA: Diagnosis not present

## 2020-11-21 DIAGNOSIS — R161 Splenomegaly, not elsewhere classified: Secondary | ICD-10-CM | POA: Diagnosis not present

## 2020-11-21 DIAGNOSIS — C770 Secondary and unspecified malignant neoplasm of lymph nodes of head, face and neck: Secondary | ICD-10-CM | POA: Diagnosis not present

## 2020-11-21 DIAGNOSIS — I10 Essential (primary) hypertension: Secondary | ICD-10-CM | POA: Diagnosis not present

## 2020-11-21 DIAGNOSIS — C9110 Chronic lymphocytic leukemia of B-cell type not having achieved remission: Secondary | ICD-10-CM | POA: Diagnosis not present

## 2020-11-21 DIAGNOSIS — R599 Enlarged lymph nodes, unspecified: Secondary | ICD-10-CM | POA: Diagnosis not present

## 2020-11-21 DIAGNOSIS — Z85828 Personal history of other malignant neoplasm of skin: Secondary | ICD-10-CM | POA: Diagnosis not present

## 2020-11-22 DIAGNOSIS — Z510 Encounter for antineoplastic radiation therapy: Secondary | ICD-10-CM | POA: Diagnosis not present

## 2020-11-22 DIAGNOSIS — C770 Secondary and unspecified malignant neoplasm of lymph nodes of head, face and neck: Secondary | ICD-10-CM | POA: Diagnosis not present

## 2020-11-22 DIAGNOSIS — C07 Malignant neoplasm of parotid gland: Secondary | ICD-10-CM | POA: Diagnosis not present

## 2020-11-22 DIAGNOSIS — I10 Essential (primary) hypertension: Secondary | ICD-10-CM | POA: Diagnosis not present

## 2020-11-22 DIAGNOSIS — C9110 Chronic lymphocytic leukemia of B-cell type not having achieved remission: Secondary | ICD-10-CM | POA: Diagnosis not present

## 2020-11-22 DIAGNOSIS — R599 Enlarged lymph nodes, unspecified: Secondary | ICD-10-CM | POA: Diagnosis not present

## 2020-11-22 DIAGNOSIS — D649 Anemia, unspecified: Secondary | ICD-10-CM | POA: Diagnosis not present

## 2020-11-22 DIAGNOSIS — R161 Splenomegaly, not elsewhere classified: Secondary | ICD-10-CM | POA: Diagnosis not present

## 2020-11-22 DIAGNOSIS — Z85828 Personal history of other malignant neoplasm of skin: Secondary | ICD-10-CM | POA: Diagnosis not present

## 2020-11-23 DIAGNOSIS — Z510 Encounter for antineoplastic radiation therapy: Secondary | ICD-10-CM | POA: Diagnosis not present

## 2020-11-23 DIAGNOSIS — C07 Malignant neoplasm of parotid gland: Secondary | ICD-10-CM | POA: Diagnosis not present

## 2020-11-23 DIAGNOSIS — C9110 Chronic lymphocytic leukemia of B-cell type not having achieved remission: Secondary | ICD-10-CM | POA: Diagnosis not present

## 2020-11-23 DIAGNOSIS — D649 Anemia, unspecified: Secondary | ICD-10-CM | POA: Diagnosis not present

## 2020-11-23 DIAGNOSIS — I10 Essential (primary) hypertension: Secondary | ICD-10-CM | POA: Diagnosis not present

## 2020-11-23 DIAGNOSIS — R161 Splenomegaly, not elsewhere classified: Secondary | ICD-10-CM | POA: Diagnosis not present

## 2020-11-23 DIAGNOSIS — C770 Secondary and unspecified malignant neoplasm of lymph nodes of head, face and neck: Secondary | ICD-10-CM | POA: Diagnosis not present

## 2020-11-23 DIAGNOSIS — Z85828 Personal history of other malignant neoplasm of skin: Secondary | ICD-10-CM | POA: Diagnosis not present

## 2020-11-23 DIAGNOSIS — R599 Enlarged lymph nodes, unspecified: Secondary | ICD-10-CM | POA: Diagnosis not present

## 2020-11-24 DIAGNOSIS — Z510 Encounter for antineoplastic radiation therapy: Secondary | ICD-10-CM | POA: Diagnosis not present

## 2020-11-24 DIAGNOSIS — D649 Anemia, unspecified: Secondary | ICD-10-CM | POA: Diagnosis not present

## 2020-11-24 DIAGNOSIS — C9110 Chronic lymphocytic leukemia of B-cell type not having achieved remission: Secondary | ICD-10-CM | POA: Diagnosis not present

## 2020-11-24 DIAGNOSIS — C07 Malignant neoplasm of parotid gland: Secondary | ICD-10-CM | POA: Diagnosis not present

## 2020-11-24 DIAGNOSIS — R599 Enlarged lymph nodes, unspecified: Secondary | ICD-10-CM | POA: Diagnosis not present

## 2020-11-24 DIAGNOSIS — I10 Essential (primary) hypertension: Secondary | ICD-10-CM | POA: Diagnosis not present

## 2020-11-24 DIAGNOSIS — R161 Splenomegaly, not elsewhere classified: Secondary | ICD-10-CM | POA: Diagnosis not present

## 2020-11-24 DIAGNOSIS — Z85828 Personal history of other malignant neoplasm of skin: Secondary | ICD-10-CM | POA: Diagnosis not present

## 2020-11-24 DIAGNOSIS — C770 Secondary and unspecified malignant neoplasm of lymph nodes of head, face and neck: Secondary | ICD-10-CM | POA: Diagnosis not present

## 2020-11-27 DIAGNOSIS — C07 Malignant neoplasm of parotid gland: Secondary | ICD-10-CM | POA: Diagnosis not present

## 2020-11-27 DIAGNOSIS — Z510 Encounter for antineoplastic radiation therapy: Secondary | ICD-10-CM | POA: Diagnosis not present

## 2020-11-27 DIAGNOSIS — I10 Essential (primary) hypertension: Secondary | ICD-10-CM | POA: Diagnosis not present

## 2020-11-27 DIAGNOSIS — R161 Splenomegaly, not elsewhere classified: Secondary | ICD-10-CM | POA: Diagnosis not present

## 2020-11-27 DIAGNOSIS — Z85828 Personal history of other malignant neoplasm of skin: Secondary | ICD-10-CM | POA: Diagnosis not present

## 2020-11-27 DIAGNOSIS — D649 Anemia, unspecified: Secondary | ICD-10-CM | POA: Diagnosis not present

## 2020-11-27 DIAGNOSIS — C770 Secondary and unspecified malignant neoplasm of lymph nodes of head, face and neck: Secondary | ICD-10-CM | POA: Diagnosis not present

## 2020-11-27 DIAGNOSIS — C9110 Chronic lymphocytic leukemia of B-cell type not having achieved remission: Secondary | ICD-10-CM | POA: Diagnosis not present

## 2020-11-27 DIAGNOSIS — R599 Enlarged lymph nodes, unspecified: Secondary | ICD-10-CM | POA: Diagnosis not present

## 2020-11-28 DIAGNOSIS — Z510 Encounter for antineoplastic radiation therapy: Secondary | ICD-10-CM | POA: Diagnosis not present

## 2020-11-28 DIAGNOSIS — C07 Malignant neoplasm of parotid gland: Secondary | ICD-10-CM | POA: Diagnosis not present

## 2020-11-28 DIAGNOSIS — C770 Secondary and unspecified malignant neoplasm of lymph nodes of head, face and neck: Secondary | ICD-10-CM | POA: Diagnosis not present

## 2020-11-28 DIAGNOSIS — Z85828 Personal history of other malignant neoplasm of skin: Secondary | ICD-10-CM | POA: Diagnosis not present

## 2020-11-28 DIAGNOSIS — D649 Anemia, unspecified: Secondary | ICD-10-CM | POA: Diagnosis not present

## 2020-11-28 DIAGNOSIS — C9110 Chronic lymphocytic leukemia of B-cell type not having achieved remission: Secondary | ICD-10-CM | POA: Diagnosis not present

## 2020-11-28 DIAGNOSIS — R599 Enlarged lymph nodes, unspecified: Secondary | ICD-10-CM | POA: Diagnosis not present

## 2020-11-28 DIAGNOSIS — I10 Essential (primary) hypertension: Secondary | ICD-10-CM | POA: Diagnosis not present

## 2020-11-28 DIAGNOSIS — R161 Splenomegaly, not elsewhere classified: Secondary | ICD-10-CM | POA: Diagnosis not present

## 2020-11-29 DIAGNOSIS — Z85828 Personal history of other malignant neoplasm of skin: Secondary | ICD-10-CM | POA: Diagnosis not present

## 2020-11-29 DIAGNOSIS — C770 Secondary and unspecified malignant neoplasm of lymph nodes of head, face and neck: Secondary | ICD-10-CM | POA: Diagnosis not present

## 2020-11-29 DIAGNOSIS — R161 Splenomegaly, not elsewhere classified: Secondary | ICD-10-CM | POA: Diagnosis not present

## 2020-11-29 DIAGNOSIS — C07 Malignant neoplasm of parotid gland: Secondary | ICD-10-CM | POA: Diagnosis not present

## 2020-11-29 DIAGNOSIS — R599 Enlarged lymph nodes, unspecified: Secondary | ICD-10-CM | POA: Diagnosis not present

## 2020-11-29 DIAGNOSIS — I10 Essential (primary) hypertension: Secondary | ICD-10-CM | POA: Diagnosis not present

## 2020-11-29 DIAGNOSIS — Z510 Encounter for antineoplastic radiation therapy: Secondary | ICD-10-CM | POA: Diagnosis not present

## 2020-11-29 DIAGNOSIS — C9110 Chronic lymphocytic leukemia of B-cell type not having achieved remission: Secondary | ICD-10-CM | POA: Diagnosis not present

## 2020-11-29 DIAGNOSIS — D649 Anemia, unspecified: Secondary | ICD-10-CM | POA: Diagnosis not present

## 2020-11-30 DIAGNOSIS — R599 Enlarged lymph nodes, unspecified: Secondary | ICD-10-CM | POA: Diagnosis not present

## 2020-11-30 DIAGNOSIS — C770 Secondary and unspecified malignant neoplasm of lymph nodes of head, face and neck: Secondary | ICD-10-CM | POA: Diagnosis not present

## 2020-11-30 DIAGNOSIS — R161 Splenomegaly, not elsewhere classified: Secondary | ICD-10-CM | POA: Diagnosis not present

## 2020-11-30 DIAGNOSIS — Z510 Encounter for antineoplastic radiation therapy: Secondary | ICD-10-CM | POA: Diagnosis not present

## 2020-11-30 DIAGNOSIS — C07 Malignant neoplasm of parotid gland: Secondary | ICD-10-CM | POA: Diagnosis not present

## 2020-11-30 DIAGNOSIS — Z85828 Personal history of other malignant neoplasm of skin: Secondary | ICD-10-CM | POA: Diagnosis not present

## 2020-11-30 DIAGNOSIS — D649 Anemia, unspecified: Secondary | ICD-10-CM | POA: Diagnosis not present

## 2020-11-30 DIAGNOSIS — I10 Essential (primary) hypertension: Secondary | ICD-10-CM | POA: Diagnosis not present

## 2020-11-30 DIAGNOSIS — C9110 Chronic lymphocytic leukemia of B-cell type not having achieved remission: Secondary | ICD-10-CM | POA: Diagnosis not present

## 2020-12-01 DIAGNOSIS — C770 Secondary and unspecified malignant neoplasm of lymph nodes of head, face and neck: Secondary | ICD-10-CM | POA: Diagnosis not present

## 2020-12-01 DIAGNOSIS — R599 Enlarged lymph nodes, unspecified: Secondary | ICD-10-CM | POA: Diagnosis not present

## 2020-12-01 DIAGNOSIS — I10 Essential (primary) hypertension: Secondary | ICD-10-CM | POA: Diagnosis not present

## 2020-12-01 DIAGNOSIS — Z510 Encounter for antineoplastic radiation therapy: Secondary | ICD-10-CM | POA: Diagnosis not present

## 2020-12-01 DIAGNOSIS — Z85828 Personal history of other malignant neoplasm of skin: Secondary | ICD-10-CM | POA: Diagnosis not present

## 2020-12-01 DIAGNOSIS — D649 Anemia, unspecified: Secondary | ICD-10-CM | POA: Diagnosis not present

## 2020-12-01 DIAGNOSIS — C07 Malignant neoplasm of parotid gland: Secondary | ICD-10-CM | POA: Diagnosis not present

## 2020-12-01 DIAGNOSIS — C9110 Chronic lymphocytic leukemia of B-cell type not having achieved remission: Secondary | ICD-10-CM | POA: Diagnosis not present

## 2020-12-01 DIAGNOSIS — R161 Splenomegaly, not elsewhere classified: Secondary | ICD-10-CM | POA: Diagnosis not present

## 2020-12-05 DIAGNOSIS — R161 Splenomegaly, not elsewhere classified: Secondary | ICD-10-CM | POA: Diagnosis not present

## 2020-12-05 DIAGNOSIS — Z85828 Personal history of other malignant neoplasm of skin: Secondary | ICD-10-CM | POA: Diagnosis not present

## 2020-12-05 DIAGNOSIS — D649 Anemia, unspecified: Secondary | ICD-10-CM | POA: Diagnosis not present

## 2020-12-05 DIAGNOSIS — I10 Essential (primary) hypertension: Secondary | ICD-10-CM | POA: Diagnosis not present

## 2020-12-05 DIAGNOSIS — Z510 Encounter for antineoplastic radiation therapy: Secondary | ICD-10-CM | POA: Diagnosis not present

## 2020-12-05 DIAGNOSIS — C770 Secondary and unspecified malignant neoplasm of lymph nodes of head, face and neck: Secondary | ICD-10-CM | POA: Diagnosis not present

## 2020-12-05 DIAGNOSIS — R599 Enlarged lymph nodes, unspecified: Secondary | ICD-10-CM | POA: Diagnosis not present

## 2020-12-05 DIAGNOSIS — C9110 Chronic lymphocytic leukemia of B-cell type not having achieved remission: Secondary | ICD-10-CM | POA: Diagnosis not present

## 2020-12-05 DIAGNOSIS — C07 Malignant neoplasm of parotid gland: Secondary | ICD-10-CM | POA: Diagnosis not present

## 2020-12-06 DIAGNOSIS — Z85828 Personal history of other malignant neoplasm of skin: Secondary | ICD-10-CM | POA: Diagnosis not present

## 2020-12-06 DIAGNOSIS — I10 Essential (primary) hypertension: Secondary | ICD-10-CM | POA: Diagnosis not present

## 2020-12-06 DIAGNOSIS — C770 Secondary and unspecified malignant neoplasm of lymph nodes of head, face and neck: Secondary | ICD-10-CM | POA: Diagnosis not present

## 2020-12-06 DIAGNOSIS — C9110 Chronic lymphocytic leukemia of B-cell type not having achieved remission: Secondary | ICD-10-CM | POA: Diagnosis not present

## 2020-12-06 DIAGNOSIS — Z510 Encounter for antineoplastic radiation therapy: Secondary | ICD-10-CM | POA: Diagnosis not present

## 2020-12-06 DIAGNOSIS — C07 Malignant neoplasm of parotid gland: Secondary | ICD-10-CM | POA: Diagnosis not present

## 2020-12-06 DIAGNOSIS — R161 Splenomegaly, not elsewhere classified: Secondary | ICD-10-CM | POA: Diagnosis not present

## 2020-12-06 DIAGNOSIS — D649 Anemia, unspecified: Secondary | ICD-10-CM | POA: Diagnosis not present

## 2020-12-06 DIAGNOSIS — R599 Enlarged lymph nodes, unspecified: Secondary | ICD-10-CM | POA: Diagnosis not present

## 2020-12-07 DIAGNOSIS — C07 Malignant neoplasm of parotid gland: Secondary | ICD-10-CM | POA: Diagnosis not present

## 2020-12-07 DIAGNOSIS — Z85828 Personal history of other malignant neoplasm of skin: Secondary | ICD-10-CM | POA: Diagnosis not present

## 2020-12-07 DIAGNOSIS — Z510 Encounter for antineoplastic radiation therapy: Secondary | ICD-10-CM | POA: Diagnosis not present

## 2020-12-07 DIAGNOSIS — R161 Splenomegaly, not elsewhere classified: Secondary | ICD-10-CM | POA: Diagnosis not present

## 2020-12-07 DIAGNOSIS — R599 Enlarged lymph nodes, unspecified: Secondary | ICD-10-CM | POA: Diagnosis not present

## 2020-12-07 DIAGNOSIS — I10 Essential (primary) hypertension: Secondary | ICD-10-CM | POA: Diagnosis not present

## 2020-12-07 DIAGNOSIS — C770 Secondary and unspecified malignant neoplasm of lymph nodes of head, face and neck: Secondary | ICD-10-CM | POA: Diagnosis not present

## 2020-12-07 DIAGNOSIS — C9110 Chronic lymphocytic leukemia of B-cell type not having achieved remission: Secondary | ICD-10-CM | POA: Diagnosis not present

## 2020-12-07 DIAGNOSIS — D649 Anemia, unspecified: Secondary | ICD-10-CM | POA: Diagnosis not present

## 2020-12-08 DIAGNOSIS — Z85828 Personal history of other malignant neoplasm of skin: Secondary | ICD-10-CM | POA: Diagnosis not present

## 2020-12-08 DIAGNOSIS — R161 Splenomegaly, not elsewhere classified: Secondary | ICD-10-CM | POA: Diagnosis not present

## 2020-12-08 DIAGNOSIS — I10 Essential (primary) hypertension: Secondary | ICD-10-CM | POA: Diagnosis not present

## 2020-12-08 DIAGNOSIS — C9110 Chronic lymphocytic leukemia of B-cell type not having achieved remission: Secondary | ICD-10-CM | POA: Diagnosis not present

## 2020-12-08 DIAGNOSIS — C770 Secondary and unspecified malignant neoplasm of lymph nodes of head, face and neck: Secondary | ICD-10-CM | POA: Diagnosis not present

## 2020-12-08 DIAGNOSIS — D649 Anemia, unspecified: Secondary | ICD-10-CM | POA: Diagnosis not present

## 2020-12-08 DIAGNOSIS — Z510 Encounter for antineoplastic radiation therapy: Secondary | ICD-10-CM | POA: Diagnosis not present

## 2020-12-08 DIAGNOSIS — C07 Malignant neoplasm of parotid gland: Secondary | ICD-10-CM | POA: Diagnosis not present

## 2020-12-08 DIAGNOSIS — R599 Enlarged lymph nodes, unspecified: Secondary | ICD-10-CM | POA: Diagnosis not present

## 2020-12-11 DIAGNOSIS — I10 Essential (primary) hypertension: Secondary | ICD-10-CM | POA: Diagnosis not present

## 2020-12-11 DIAGNOSIS — C07 Malignant neoplasm of parotid gland: Secondary | ICD-10-CM | POA: Diagnosis not present

## 2020-12-11 DIAGNOSIS — C770 Secondary and unspecified malignant neoplasm of lymph nodes of head, face and neck: Secondary | ICD-10-CM | POA: Diagnosis not present

## 2020-12-11 DIAGNOSIS — Z510 Encounter for antineoplastic radiation therapy: Secondary | ICD-10-CM | POA: Diagnosis not present

## 2020-12-11 DIAGNOSIS — R599 Enlarged lymph nodes, unspecified: Secondary | ICD-10-CM | POA: Diagnosis not present

## 2020-12-11 DIAGNOSIS — R161 Splenomegaly, not elsewhere classified: Secondary | ICD-10-CM | POA: Diagnosis not present

## 2020-12-11 DIAGNOSIS — C9110 Chronic lymphocytic leukemia of B-cell type not having achieved remission: Secondary | ICD-10-CM | POA: Diagnosis not present

## 2020-12-11 DIAGNOSIS — Z85828 Personal history of other malignant neoplasm of skin: Secondary | ICD-10-CM | POA: Diagnosis not present

## 2020-12-11 DIAGNOSIS — D649 Anemia, unspecified: Secondary | ICD-10-CM | POA: Diagnosis not present

## 2020-12-12 DIAGNOSIS — Z510 Encounter for antineoplastic radiation therapy: Secondary | ICD-10-CM | POA: Diagnosis not present

## 2020-12-12 DIAGNOSIS — R599 Enlarged lymph nodes, unspecified: Secondary | ICD-10-CM | POA: Diagnosis not present

## 2020-12-12 DIAGNOSIS — C770 Secondary and unspecified malignant neoplasm of lymph nodes of head, face and neck: Secondary | ICD-10-CM | POA: Diagnosis not present

## 2020-12-12 DIAGNOSIS — D649 Anemia, unspecified: Secondary | ICD-10-CM | POA: Diagnosis not present

## 2020-12-12 DIAGNOSIS — R161 Splenomegaly, not elsewhere classified: Secondary | ICD-10-CM | POA: Diagnosis not present

## 2020-12-12 DIAGNOSIS — Z85828 Personal history of other malignant neoplasm of skin: Secondary | ICD-10-CM | POA: Diagnosis not present

## 2020-12-12 DIAGNOSIS — C07 Malignant neoplasm of parotid gland: Secondary | ICD-10-CM | POA: Diagnosis not present

## 2020-12-12 DIAGNOSIS — I10 Essential (primary) hypertension: Secondary | ICD-10-CM | POA: Diagnosis not present

## 2020-12-12 DIAGNOSIS — C9110 Chronic lymphocytic leukemia of B-cell type not having achieved remission: Secondary | ICD-10-CM | POA: Diagnosis not present

## 2020-12-13 DIAGNOSIS — Z510 Encounter for antineoplastic radiation therapy: Secondary | ICD-10-CM | POA: Diagnosis not present

## 2020-12-13 DIAGNOSIS — R599 Enlarged lymph nodes, unspecified: Secondary | ICD-10-CM | POA: Diagnosis not present

## 2020-12-13 DIAGNOSIS — C770 Secondary and unspecified malignant neoplasm of lymph nodes of head, face and neck: Secondary | ICD-10-CM | POA: Diagnosis not present

## 2020-12-13 DIAGNOSIS — Z85828 Personal history of other malignant neoplasm of skin: Secondary | ICD-10-CM | POA: Diagnosis not present

## 2020-12-13 DIAGNOSIS — C07 Malignant neoplasm of parotid gland: Secondary | ICD-10-CM | POA: Diagnosis not present

## 2020-12-13 DIAGNOSIS — I10 Essential (primary) hypertension: Secondary | ICD-10-CM | POA: Diagnosis not present

## 2020-12-13 DIAGNOSIS — D649 Anemia, unspecified: Secondary | ICD-10-CM | POA: Diagnosis not present

## 2020-12-13 DIAGNOSIS — R161 Splenomegaly, not elsewhere classified: Secondary | ICD-10-CM | POA: Diagnosis not present

## 2020-12-13 DIAGNOSIS — C9110 Chronic lymphocytic leukemia of B-cell type not having achieved remission: Secondary | ICD-10-CM | POA: Diagnosis not present

## 2020-12-14 DIAGNOSIS — Z85828 Personal history of other malignant neoplasm of skin: Secondary | ICD-10-CM | POA: Diagnosis not present

## 2020-12-14 DIAGNOSIS — Z510 Encounter for antineoplastic radiation therapy: Secondary | ICD-10-CM | POA: Diagnosis not present

## 2020-12-14 DIAGNOSIS — R599 Enlarged lymph nodes, unspecified: Secondary | ICD-10-CM | POA: Diagnosis not present

## 2020-12-14 DIAGNOSIS — D649 Anemia, unspecified: Secondary | ICD-10-CM | POA: Diagnosis not present

## 2020-12-14 DIAGNOSIS — R161 Splenomegaly, not elsewhere classified: Secondary | ICD-10-CM | POA: Diagnosis not present

## 2020-12-14 DIAGNOSIS — I10 Essential (primary) hypertension: Secondary | ICD-10-CM | POA: Diagnosis not present

## 2020-12-14 DIAGNOSIS — C9110 Chronic lymphocytic leukemia of B-cell type not having achieved remission: Secondary | ICD-10-CM | POA: Diagnosis not present

## 2020-12-14 DIAGNOSIS — C770 Secondary and unspecified malignant neoplasm of lymph nodes of head, face and neck: Secondary | ICD-10-CM | POA: Diagnosis not present

## 2020-12-14 DIAGNOSIS — C07 Malignant neoplasm of parotid gland: Secondary | ICD-10-CM | POA: Diagnosis not present

## 2020-12-15 DIAGNOSIS — C9110 Chronic lymphocytic leukemia of B-cell type not having achieved remission: Secondary | ICD-10-CM | POA: Diagnosis not present

## 2020-12-15 DIAGNOSIS — C07 Malignant neoplasm of parotid gland: Secondary | ICD-10-CM | POA: Diagnosis not present

## 2020-12-15 DIAGNOSIS — I10 Essential (primary) hypertension: Secondary | ICD-10-CM | POA: Diagnosis not present

## 2020-12-15 DIAGNOSIS — C770 Secondary and unspecified malignant neoplasm of lymph nodes of head, face and neck: Secondary | ICD-10-CM | POA: Diagnosis not present

## 2020-12-15 DIAGNOSIS — R599 Enlarged lymph nodes, unspecified: Secondary | ICD-10-CM | POA: Diagnosis not present

## 2020-12-15 DIAGNOSIS — Z510 Encounter for antineoplastic radiation therapy: Secondary | ICD-10-CM | POA: Diagnosis not present

## 2020-12-15 DIAGNOSIS — Z85828 Personal history of other malignant neoplasm of skin: Secondary | ICD-10-CM | POA: Diagnosis not present

## 2020-12-15 DIAGNOSIS — D649 Anemia, unspecified: Secondary | ICD-10-CM | POA: Diagnosis not present

## 2020-12-15 DIAGNOSIS — R161 Splenomegaly, not elsewhere classified: Secondary | ICD-10-CM | POA: Diagnosis not present

## 2020-12-18 DIAGNOSIS — R161 Splenomegaly, not elsewhere classified: Secondary | ICD-10-CM | POA: Diagnosis not present

## 2020-12-18 DIAGNOSIS — Z85828 Personal history of other malignant neoplasm of skin: Secondary | ICD-10-CM | POA: Diagnosis not present

## 2020-12-18 DIAGNOSIS — D649 Anemia, unspecified: Secondary | ICD-10-CM | POA: Diagnosis not present

## 2020-12-18 DIAGNOSIS — C770 Secondary and unspecified malignant neoplasm of lymph nodes of head, face and neck: Secondary | ICD-10-CM | POA: Diagnosis not present

## 2020-12-18 DIAGNOSIS — I10 Essential (primary) hypertension: Secondary | ICD-10-CM | POA: Diagnosis not present

## 2020-12-18 DIAGNOSIS — C07 Malignant neoplasm of parotid gland: Secondary | ICD-10-CM | POA: Diagnosis not present

## 2020-12-18 DIAGNOSIS — R599 Enlarged lymph nodes, unspecified: Secondary | ICD-10-CM | POA: Diagnosis not present

## 2020-12-18 DIAGNOSIS — Z510 Encounter for antineoplastic radiation therapy: Secondary | ICD-10-CM | POA: Diagnosis not present

## 2020-12-18 DIAGNOSIS — C9110 Chronic lymphocytic leukemia of B-cell type not having achieved remission: Secondary | ICD-10-CM | POA: Diagnosis not present

## 2020-12-27 DIAGNOSIS — Z510 Encounter for antineoplastic radiation therapy: Secondary | ICD-10-CM | POA: Diagnosis not present

## 2020-12-27 DIAGNOSIS — R161 Splenomegaly, not elsewhere classified: Secondary | ICD-10-CM | POA: Diagnosis not present

## 2020-12-27 DIAGNOSIS — I10 Essential (primary) hypertension: Secondary | ICD-10-CM | POA: Diagnosis not present

## 2020-12-27 DIAGNOSIS — Z85828 Personal history of other malignant neoplasm of skin: Secondary | ICD-10-CM | POA: Diagnosis not present

## 2020-12-27 DIAGNOSIS — C770 Secondary and unspecified malignant neoplasm of lymph nodes of head, face and neck: Secondary | ICD-10-CM | POA: Diagnosis not present

## 2020-12-27 DIAGNOSIS — D649 Anemia, unspecified: Secondary | ICD-10-CM | POA: Diagnosis not present

## 2020-12-27 DIAGNOSIS — C9110 Chronic lymphocytic leukemia of B-cell type not having achieved remission: Secondary | ICD-10-CM | POA: Diagnosis not present

## 2020-12-27 DIAGNOSIS — C07 Malignant neoplasm of parotid gland: Secondary | ICD-10-CM | POA: Diagnosis not present

## 2020-12-27 DIAGNOSIS — R599 Enlarged lymph nodes, unspecified: Secondary | ICD-10-CM | POA: Diagnosis not present

## 2021-01-17 DIAGNOSIS — C9110 Chronic lymphocytic leukemia of B-cell type not having achieved remission: Secondary | ICD-10-CM | POA: Diagnosis not present

## 2021-02-21 DIAGNOSIS — C799 Secondary malignant neoplasm of unspecified site: Secondary | ICD-10-CM | POA: Diagnosis not present

## 2021-02-21 DIAGNOSIS — C9111 Chronic lymphocytic leukemia of B-cell type in remission: Secondary | ICD-10-CM | POA: Diagnosis not present

## 2021-02-21 DIAGNOSIS — C760 Malignant neoplasm of head, face and neck: Secondary | ICD-10-CM | POA: Diagnosis not present

## 2021-02-26 DIAGNOSIS — C4441 Basal cell carcinoma of skin of scalp and neck: Secondary | ICD-10-CM | POA: Diagnosis not present

## 2021-02-26 DIAGNOSIS — D0439 Carcinoma in situ of skin of other parts of face: Secondary | ICD-10-CM | POA: Diagnosis not present

## 2021-02-26 DIAGNOSIS — Z85828 Personal history of other malignant neoplasm of skin: Secondary | ICD-10-CM | POA: Diagnosis not present

## 2021-02-26 DIAGNOSIS — C44321 Squamous cell carcinoma of skin of nose: Secondary | ICD-10-CM | POA: Diagnosis not present

## 2021-02-26 DIAGNOSIS — D492 Neoplasm of unspecified behavior of bone, soft tissue, and skin: Secondary | ICD-10-CM | POA: Diagnosis not present

## 2021-03-07 DIAGNOSIS — C44320 Squamous cell carcinoma of skin of unspecified parts of face: Secondary | ICD-10-CM | POA: Diagnosis not present

## 2021-03-07 DIAGNOSIS — Z9229 Personal history of other drug therapy: Secondary | ICD-10-CM | POA: Diagnosis not present

## 2021-03-07 DIAGNOSIS — Z1329 Encounter for screening for other suspected endocrine disorder: Secondary | ICD-10-CM | POA: Diagnosis not present

## 2021-03-21 DIAGNOSIS — Z5112 Encounter for antineoplastic immunotherapy: Secondary | ICD-10-CM | POA: Diagnosis not present

## 2021-03-21 DIAGNOSIS — C4491 Basal cell carcinoma of skin, unspecified: Secondary | ICD-10-CM | POA: Diagnosis not present

## 2021-03-21 DIAGNOSIS — Z79899 Other long term (current) drug therapy: Secondary | ICD-10-CM | POA: Diagnosis not present

## 2021-03-21 DIAGNOSIS — Z9229 Personal history of other drug therapy: Secondary | ICD-10-CM | POA: Diagnosis not present

## 2021-03-21 DIAGNOSIS — Z1329 Encounter for screening for other suspected endocrine disorder: Secondary | ICD-10-CM | POA: Diagnosis not present

## 2021-04-09 DIAGNOSIS — Z79899 Other long term (current) drug therapy: Secondary | ICD-10-CM | POA: Diagnosis not present

## 2021-04-09 DIAGNOSIS — C4441 Basal cell carcinoma of skin of scalp and neck: Secondary | ICD-10-CM | POA: Diagnosis not present

## 2021-04-09 DIAGNOSIS — C9110 Chronic lymphocytic leukemia of B-cell type not having achieved remission: Secondary | ICD-10-CM | POA: Diagnosis not present

## 2021-04-09 DIAGNOSIS — C760 Malignant neoplasm of head, face and neck: Secondary | ICD-10-CM | POA: Diagnosis not present

## 2021-04-09 DIAGNOSIS — C770 Secondary and unspecified malignant neoplasm of lymph nodes of head, face and neck: Secondary | ICD-10-CM | POA: Diagnosis not present

## 2021-04-09 DIAGNOSIS — R590 Localized enlarged lymph nodes: Secondary | ICD-10-CM | POA: Diagnosis not present

## 2021-04-09 DIAGNOSIS — M278 Other specified diseases of jaws: Secondary | ICD-10-CM | POA: Diagnosis not present

## 2021-04-09 DIAGNOSIS — M799 Soft tissue disorder, unspecified: Secondary | ICD-10-CM | POA: Diagnosis not present

## 2021-04-09 DIAGNOSIS — D098 Carcinoma in situ of other specified sites: Secondary | ICD-10-CM | POA: Diagnosis not present

## 2021-04-09 DIAGNOSIS — R599 Enlarged lymph nodes, unspecified: Secondary | ICD-10-CM | POA: Diagnosis not present

## 2021-04-09 DIAGNOSIS — Z9089 Acquired absence of other organs: Secondary | ICD-10-CM | POA: Diagnosis not present

## 2021-04-12 DIAGNOSIS — D649 Anemia, unspecified: Secondary | ICD-10-CM | POA: Diagnosis not present

## 2021-04-12 DIAGNOSIS — Z923 Personal history of irradiation: Secondary | ICD-10-CM | POA: Diagnosis not present

## 2021-04-12 DIAGNOSIS — Z1329 Encounter for screening for other suspected endocrine disorder: Secondary | ICD-10-CM | POA: Diagnosis not present

## 2021-04-12 DIAGNOSIS — Z9229 Personal history of other drug therapy: Secondary | ICD-10-CM | POA: Diagnosis not present

## 2021-04-12 DIAGNOSIS — Z5112 Encounter for antineoplastic immunotherapy: Secondary | ICD-10-CM | POA: Diagnosis not present

## 2021-04-12 DIAGNOSIS — C9110 Chronic lymphocytic leukemia of B-cell type not having achieved remission: Secondary | ICD-10-CM | POA: Diagnosis not present

## 2021-04-12 DIAGNOSIS — Z9221 Personal history of antineoplastic chemotherapy: Secondary | ICD-10-CM | POA: Diagnosis not present

## 2021-04-12 DIAGNOSIS — Z79899 Other long term (current) drug therapy: Secondary | ICD-10-CM | POA: Diagnosis not present

## 2021-04-12 DIAGNOSIS — R161 Splenomegaly, not elsewhere classified: Secondary | ICD-10-CM | POA: Diagnosis not present

## 2021-04-12 DIAGNOSIS — C801 Malignant (primary) neoplasm, unspecified: Secondary | ICD-10-CM | POA: Diagnosis not present

## 2021-04-12 DIAGNOSIS — C4491 Basal cell carcinoma of skin, unspecified: Secondary | ICD-10-CM | POA: Diagnosis not present

## 2021-04-12 DIAGNOSIS — R591 Generalized enlarged lymph nodes: Secondary | ICD-10-CM | POA: Diagnosis not present

## 2021-04-18 DIAGNOSIS — C9110 Chronic lymphocytic leukemia of B-cell type not having achieved remission: Secondary | ICD-10-CM | POA: Diagnosis not present

## 2021-04-25 DIAGNOSIS — C9111 Chronic lymphocytic leukemia of B-cell type in remission: Secondary | ICD-10-CM | POA: Diagnosis not present

## 2021-04-25 DIAGNOSIS — C760 Malignant neoplasm of head, face and neck: Secondary | ICD-10-CM | POA: Diagnosis not present

## 2021-04-25 DIAGNOSIS — Z09 Encounter for follow-up examination after completed treatment for conditions other than malignant neoplasm: Secondary | ICD-10-CM | POA: Diagnosis not present

## 2021-04-25 DIAGNOSIS — C4492 Squamous cell carcinoma of skin, unspecified: Secondary | ICD-10-CM | POA: Diagnosis not present

## 2021-05-03 DIAGNOSIS — Z803 Family history of malignant neoplasm of breast: Secondary | ICD-10-CM | POA: Diagnosis not present

## 2021-05-03 DIAGNOSIS — C4491 Basal cell carcinoma of skin, unspecified: Secondary | ICD-10-CM | POA: Diagnosis not present

## 2021-05-03 DIAGNOSIS — C9110 Chronic lymphocytic leukemia of B-cell type not having achieved remission: Secondary | ICD-10-CM | POA: Diagnosis not present

## 2021-05-03 DIAGNOSIS — C801 Malignant (primary) neoplasm, unspecified: Secondary | ICD-10-CM | POA: Diagnosis not present

## 2021-05-03 DIAGNOSIS — Z923 Personal history of irradiation: Secondary | ICD-10-CM | POA: Diagnosis not present

## 2021-05-03 DIAGNOSIS — E039 Hypothyroidism, unspecified: Secondary | ICD-10-CM | POA: Diagnosis not present

## 2021-05-03 DIAGNOSIS — Z9221 Personal history of antineoplastic chemotherapy: Secondary | ICD-10-CM | POA: Diagnosis not present

## 2021-05-03 DIAGNOSIS — Z79899 Other long term (current) drug therapy: Secondary | ICD-10-CM | POA: Diagnosis not present

## 2021-05-03 DIAGNOSIS — C4492 Squamous cell carcinoma of skin, unspecified: Secondary | ICD-10-CM | POA: Diagnosis not present

## 2021-05-03 DIAGNOSIS — Z9229 Personal history of other drug therapy: Secondary | ICD-10-CM | POA: Diagnosis not present

## 2021-05-03 DIAGNOSIS — Z1329 Encounter for screening for other suspected endocrine disorder: Secondary | ICD-10-CM | POA: Diagnosis not present

## 2021-05-03 DIAGNOSIS — Z5112 Encounter for antineoplastic immunotherapy: Secondary | ICD-10-CM | POA: Diagnosis not present

## 2021-05-15 IMAGING — CT NM PET TUM IMG INITIAL (PI) SKULL BASE T - THIGH
1 of 9 series · 1 of 25 positions shown · non-contrast
Comparison: None.

CLINICAL DATA: Initial treatment strategy for cutaneous carcinoma
lesion.

EXAM:
NUCLEAR MEDICINE PET SKULL BASE TO THIGH
TECHNIQUE: 9.6 mCi F-18 FDG was injected intravenously. Full-ring PET imaging
was performed from the skull base to thigh after the radiotracer. CT
data was obtained and used for attenuation correction and anatomic
localization.
Fasting blood glucose: 87 mg/dl

[Series 3: ct wb 5.0 b30f · axial · 5.0mm · 0.98mm/px · 1 of 329 slices shown]
[im 329/329  brain]
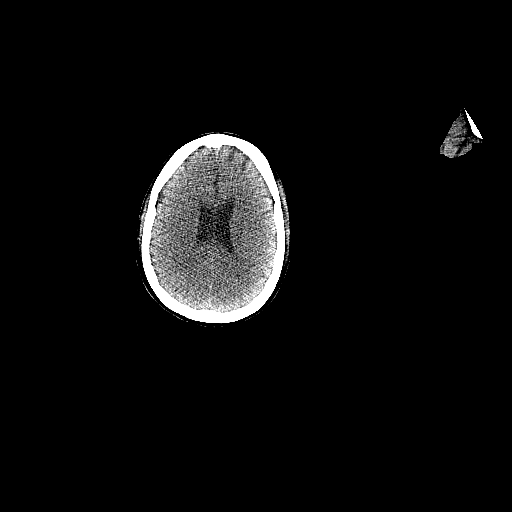

[1 of 25 positions shown; findings below may reference images not displayed]

FINDINGS: Mediastinal blood pool activity: SUV max

Liver activity: SUV max

NECK: Within the medial aspect of the RIGHT parotid gland there is a
12 mm hypermetabolic nodule with SUV max equal 5.9 on image 296.
There is a larger nodule within the RIGHT parotid gland measuring 21
mm more superficial which does not have hypermetabolic activity.
Several small nodules within the LEFT parotid gland measuring 5 mm
to 12 mm which do not have metabolic activity

Bilateral level 2 lymph nodes have low metabolic activity consistent
with given history of CLL. For example 16 mm node on image 275 in
the LEFT level 2 nodal station with SUV max equal

Additional submental and posterior triangle lymph nodes with low
metabolic activity

Incidental CT findings: none

CHEST: Within the skin surface of the RIGHT back superficial to the
scapula, oblong mass measures 4.2 x 2.0 cm with intense metabolic
activity (SUV max equal 8.7).

Within the LEFT back at similar level there is a small focus of
cutaneous activity with SUV max equal 2.7. No clear lesion on CT
portion (image 273).

Incidental CT findings: Bilateral enlarged axillary lymph nodes with
low metabolic activity consistent with CLL

ABDOMEN/PELVIS: Spleen is enlarged with normal metabolic activity.
Spleen measures 19 cm in craniocaudad dimension

Enlarged external iliac and inguinal nodes with low metabolic
activity.

No abnormal activity in the pancreas or liver.

Incidental CT findings: none

SKELETON: No focal hypermetabolic activity to suggest skeletal
metastasis.

Incidental CT findings: none
IMPRESSION: 1. Hypermetabolic cutaneous mass exophytic from the RIGHT back.
2. Small hypermetabolic cutaneous focus in the LEFT back without
clear CT correlation.
3. Hypermetabolic nodule within the medial aspect of the RIGHT
parotid gland is favored primary parotid neoplasm. Additional
bilateral non hypermetabolic nodules within LEFT and RIGHT parotid
gland. Consider ENT consultation.
4. Multiple enlarged lymph nodes with low metabolic activity
consistent with given history of chronic lymphocytic leukemia.
Enlarged lymph nodes include neck lymph nodes, axillary nodes, iliac
nodes and inguinal nodes.
5. Splenomegaly with low metabolic activity also consistent with
chronic lymphocytic leukemia.

## 2021-05-22 DIAGNOSIS — Z85828 Personal history of other malignant neoplasm of skin: Secondary | ICD-10-CM | POA: Diagnosis not present

## 2021-05-22 DIAGNOSIS — C44320 Squamous cell carcinoma of skin of unspecified parts of face: Secondary | ICD-10-CM | POA: Diagnosis not present

## 2021-05-22 DIAGNOSIS — L570 Actinic keratosis: Secondary | ICD-10-CM | POA: Diagnosis not present

## 2021-05-22 DIAGNOSIS — L578 Other skin changes due to chronic exposure to nonionizing radiation: Secondary | ICD-10-CM | POA: Diagnosis not present

## 2021-05-24 DIAGNOSIS — R161 Splenomegaly, not elsewhere classified: Secondary | ICD-10-CM | POA: Diagnosis not present

## 2021-05-24 DIAGNOSIS — C801 Malignant (primary) neoplasm, unspecified: Secondary | ICD-10-CM | POA: Diagnosis not present

## 2021-05-24 DIAGNOSIS — D649 Anemia, unspecified: Secondary | ICD-10-CM | POA: Diagnosis not present

## 2021-05-24 DIAGNOSIS — C9110 Chronic lymphocytic leukemia of B-cell type not having achieved remission: Secondary | ICD-10-CM | POA: Diagnosis not present

## 2021-05-24 DIAGNOSIS — Z9229 Personal history of other drug therapy: Secondary | ICD-10-CM | POA: Diagnosis not present

## 2021-05-24 DIAGNOSIS — Z803 Family history of malignant neoplasm of breast: Secondary | ICD-10-CM | POA: Diagnosis not present

## 2021-05-24 DIAGNOSIS — C4491 Basal cell carcinoma of skin, unspecified: Secondary | ICD-10-CM | POA: Diagnosis not present

## 2021-05-24 DIAGNOSIS — Z5112 Encounter for antineoplastic immunotherapy: Secondary | ICD-10-CM | POA: Diagnosis not present

## 2021-05-24 DIAGNOSIS — R591 Generalized enlarged lymph nodes: Secondary | ICD-10-CM | POA: Diagnosis not present

## 2021-05-24 DIAGNOSIS — Z923 Personal history of irradiation: Secondary | ICD-10-CM | POA: Diagnosis not present

## 2021-05-24 DIAGNOSIS — Z79899 Other long term (current) drug therapy: Secondary | ICD-10-CM | POA: Diagnosis not present

## 2021-05-24 DIAGNOSIS — Z1329 Encounter for screening for other suspected endocrine disorder: Secondary | ICD-10-CM | POA: Diagnosis not present

## 2021-05-24 DIAGNOSIS — Z9221 Personal history of antineoplastic chemotherapy: Secondary | ICD-10-CM | POA: Diagnosis not present

## 2021-06-11 DIAGNOSIS — Z85828 Personal history of other malignant neoplasm of skin: Secondary | ICD-10-CM | POA: Diagnosis not present

## 2021-06-11 DIAGNOSIS — C07 Malignant neoplasm of parotid gland: Secondary | ICD-10-CM | POA: Diagnosis not present

## 2021-06-11 DIAGNOSIS — R599 Enlarged lymph nodes, unspecified: Secondary | ICD-10-CM | POA: Diagnosis not present

## 2021-06-11 DIAGNOSIS — C9110 Chronic lymphocytic leukemia of B-cell type not having achieved remission: Secondary | ICD-10-CM | POA: Diagnosis not present

## 2021-06-11 DIAGNOSIS — Z510 Encounter for antineoplastic radiation therapy: Secondary | ICD-10-CM | POA: Diagnosis not present

## 2021-06-11 DIAGNOSIS — C770 Secondary and unspecified malignant neoplasm of lymph nodes of head, face and neck: Secondary | ICD-10-CM | POA: Diagnosis not present

## 2021-06-11 DIAGNOSIS — R161 Splenomegaly, not elsewhere classified: Secondary | ICD-10-CM | POA: Diagnosis not present

## 2021-06-11 DIAGNOSIS — I10 Essential (primary) hypertension: Secondary | ICD-10-CM | POA: Diagnosis not present

## 2021-06-11 DIAGNOSIS — D649 Anemia, unspecified: Secondary | ICD-10-CM | POA: Diagnosis not present

## 2021-06-14 DIAGNOSIS — Z1329 Encounter for screening for other suspected endocrine disorder: Secondary | ICD-10-CM | POA: Diagnosis not present

## 2021-06-14 DIAGNOSIS — C4491 Basal cell carcinoma of skin, unspecified: Secondary | ICD-10-CM | POA: Diagnosis not present

## 2021-06-14 DIAGNOSIS — C7989 Secondary malignant neoplasm of other specified sites: Secondary | ICD-10-CM | POA: Diagnosis not present

## 2021-06-14 DIAGNOSIS — Z9229 Personal history of other drug therapy: Secondary | ICD-10-CM | POA: Diagnosis not present

## 2021-06-14 DIAGNOSIS — C44321 Squamous cell carcinoma of skin of nose: Secondary | ICD-10-CM | POA: Diagnosis not present

## 2021-06-14 DIAGNOSIS — Z79899 Other long term (current) drug therapy: Secondary | ICD-10-CM | POA: Diagnosis not present

## 2021-06-14 DIAGNOSIS — C801 Malignant (primary) neoplasm, unspecified: Secondary | ICD-10-CM | POA: Diagnosis not present

## 2021-06-14 DIAGNOSIS — Z5112 Encounter for antineoplastic immunotherapy: Secondary | ICD-10-CM | POA: Diagnosis not present

## 2021-06-14 DIAGNOSIS — C9110 Chronic lymphocytic leukemia of B-cell type not having achieved remission: Secondary | ICD-10-CM | POA: Diagnosis not present

## 2021-06-14 DIAGNOSIS — C770 Secondary and unspecified malignant neoplasm of lymph nodes of head, face and neck: Secondary | ICD-10-CM | POA: Diagnosis not present

## 2021-07-05 DIAGNOSIS — C4491 Basal cell carcinoma of skin, unspecified: Secondary | ICD-10-CM | POA: Diagnosis not present

## 2021-07-05 DIAGNOSIS — R591 Generalized enlarged lymph nodes: Secondary | ICD-10-CM | POA: Diagnosis not present

## 2021-07-05 DIAGNOSIS — Z9221 Personal history of antineoplastic chemotherapy: Secondary | ICD-10-CM | POA: Diagnosis not present

## 2021-07-05 DIAGNOSIS — Z9229 Personal history of other drug therapy: Secondary | ICD-10-CM | POA: Diagnosis not present

## 2021-07-05 DIAGNOSIS — Z1329 Encounter for screening for other suspected endocrine disorder: Secondary | ICD-10-CM | POA: Diagnosis not present

## 2021-07-05 DIAGNOSIS — Z79899 Other long term (current) drug therapy: Secondary | ICD-10-CM | POA: Diagnosis not present

## 2021-07-05 DIAGNOSIS — D649 Anemia, unspecified: Secondary | ICD-10-CM | POA: Diagnosis not present

## 2021-07-05 DIAGNOSIS — Z803 Family history of malignant neoplasm of breast: Secondary | ICD-10-CM | POA: Diagnosis not present

## 2021-07-05 DIAGNOSIS — Z5112 Encounter for antineoplastic immunotherapy: Secondary | ICD-10-CM | POA: Diagnosis not present

## 2021-07-05 DIAGNOSIS — C9110 Chronic lymphocytic leukemia of B-cell type not having achieved remission: Secondary | ICD-10-CM | POA: Diagnosis not present

## 2021-07-05 DIAGNOSIS — C801 Malignant (primary) neoplasm, unspecified: Secondary | ICD-10-CM | POA: Diagnosis not present

## 2021-07-05 DIAGNOSIS — R161 Splenomegaly, not elsewhere classified: Secondary | ICD-10-CM | POA: Diagnosis not present

## 2021-07-24 DIAGNOSIS — C4491 Basal cell carcinoma of skin, unspecified: Secondary | ICD-10-CM | POA: Diagnosis not present

## 2021-07-24 DIAGNOSIS — D649 Anemia, unspecified: Secondary | ICD-10-CM | POA: Diagnosis not present

## 2021-07-24 DIAGNOSIS — Z9229 Personal history of other drug therapy: Secondary | ICD-10-CM | POA: Diagnosis not present

## 2021-07-24 DIAGNOSIS — Z79899 Other long term (current) drug therapy: Secondary | ICD-10-CM | POA: Diagnosis not present

## 2021-07-24 DIAGNOSIS — C801 Malignant (primary) neoplasm, unspecified: Secondary | ICD-10-CM | POA: Diagnosis not present

## 2021-07-24 DIAGNOSIS — Z9221 Personal history of antineoplastic chemotherapy: Secondary | ICD-10-CM | POA: Diagnosis not present

## 2021-07-24 DIAGNOSIS — R161 Splenomegaly, not elsewhere classified: Secondary | ICD-10-CM | POA: Diagnosis not present

## 2021-07-24 DIAGNOSIS — Z1329 Encounter for screening for other suspected endocrine disorder: Secondary | ICD-10-CM | POA: Diagnosis not present

## 2021-07-24 DIAGNOSIS — Z5112 Encounter for antineoplastic immunotherapy: Secondary | ICD-10-CM | POA: Diagnosis not present

## 2021-07-24 DIAGNOSIS — R591 Generalized enlarged lymph nodes: Secondary | ICD-10-CM | POA: Diagnosis not present

## 2021-07-30 DIAGNOSIS — C44320 Squamous cell carcinoma of skin of unspecified parts of face: Secondary | ICD-10-CM | POA: Diagnosis not present

## 2021-07-30 DIAGNOSIS — L578 Other skin changes due to chronic exposure to nonionizing radiation: Secondary | ICD-10-CM | POA: Diagnosis not present

## 2021-07-30 DIAGNOSIS — Z85828 Personal history of other malignant neoplasm of skin: Secondary | ICD-10-CM | POA: Diagnosis not present

## 2021-07-30 DIAGNOSIS — L570 Actinic keratosis: Secondary | ICD-10-CM | POA: Diagnosis not present

## 2021-08-16 DIAGNOSIS — Z1329 Encounter for screening for other suspected endocrine disorder: Secondary | ICD-10-CM | POA: Diagnosis not present

## 2021-08-16 DIAGNOSIS — Z79899 Other long term (current) drug therapy: Secondary | ICD-10-CM | POA: Diagnosis not present

## 2021-08-16 DIAGNOSIS — C4491 Basal cell carcinoma of skin, unspecified: Secondary | ICD-10-CM | POA: Diagnosis not present

## 2021-08-16 DIAGNOSIS — Z9229 Personal history of other drug therapy: Secondary | ICD-10-CM | POA: Diagnosis not present

## 2021-08-16 DIAGNOSIS — E032 Hypothyroidism due to medicaments and other exogenous substances: Secondary | ICD-10-CM | POA: Diagnosis not present

## 2021-08-16 DIAGNOSIS — C801 Malignant (primary) neoplasm, unspecified: Secondary | ICD-10-CM | POA: Diagnosis not present

## 2021-08-16 DIAGNOSIS — Z5112 Encounter for antineoplastic immunotherapy: Secondary | ICD-10-CM | POA: Diagnosis not present

## 2021-08-16 DIAGNOSIS — Z79890 Hormone replacement therapy (postmenopausal): Secondary | ICD-10-CM | POA: Diagnosis not present

## 2021-08-29 DIAGNOSIS — H9190 Unspecified hearing loss, unspecified ear: Secondary | ICD-10-CM | POA: Diagnosis not present

## 2021-08-29 DIAGNOSIS — C4492 Squamous cell carcinoma of skin, unspecified: Secondary | ICD-10-CM | POA: Diagnosis not present

## 2021-08-29 DIAGNOSIS — H6121 Impacted cerumen, right ear: Secondary | ICD-10-CM | POA: Diagnosis not present

## 2021-08-29 DIAGNOSIS — C07 Malignant neoplasm of parotid gland: Secondary | ICD-10-CM | POA: Diagnosis not present

## 2021-08-29 DIAGNOSIS — R590 Localized enlarged lymph nodes: Secondary | ICD-10-CM | POA: Diagnosis not present

## 2021-08-29 DIAGNOSIS — C9111 Chronic lymphocytic leukemia of B-cell type in remission: Secondary | ICD-10-CM | POA: Diagnosis not present

## 2021-09-06 DIAGNOSIS — Z923 Personal history of irradiation: Secondary | ICD-10-CM | POA: Diagnosis not present

## 2021-09-06 DIAGNOSIS — Z1329 Encounter for screening for other suspected endocrine disorder: Secondary | ICD-10-CM | POA: Diagnosis not present

## 2021-09-06 DIAGNOSIS — Z9229 Personal history of other drug therapy: Secondary | ICD-10-CM | POA: Diagnosis not present

## 2021-09-06 DIAGNOSIS — Z803 Family history of malignant neoplasm of breast: Secondary | ICD-10-CM | POA: Diagnosis not present

## 2021-09-06 DIAGNOSIS — Z79899 Other long term (current) drug therapy: Secondary | ICD-10-CM | POA: Diagnosis not present

## 2021-09-06 DIAGNOSIS — C9110 Chronic lymphocytic leukemia of B-cell type not having achieved remission: Secondary | ICD-10-CM | POA: Diagnosis not present

## 2021-09-06 DIAGNOSIS — C4491 Basal cell carcinoma of skin, unspecified: Secondary | ICD-10-CM | POA: Diagnosis not present

## 2021-09-06 DIAGNOSIS — Z9221 Personal history of antineoplastic chemotherapy: Secondary | ICD-10-CM | POA: Diagnosis not present

## 2021-09-06 DIAGNOSIS — C801 Malignant (primary) neoplasm, unspecified: Secondary | ICD-10-CM | POA: Diagnosis not present

## 2021-09-06 DIAGNOSIS — Z5112 Encounter for antineoplastic immunotherapy: Secondary | ICD-10-CM | POA: Diagnosis not present

## 2021-09-24 DIAGNOSIS — C9111 Chronic lymphocytic leukemia of B-cell type in remission: Secondary | ICD-10-CM | POA: Diagnosis not present

## 2021-09-27 DIAGNOSIS — C801 Malignant (primary) neoplasm, unspecified: Secondary | ICD-10-CM | POA: Diagnosis not present

## 2021-09-27 DIAGNOSIS — Z1329 Encounter for screening for other suspected endocrine disorder: Secondary | ICD-10-CM | POA: Diagnosis not present

## 2021-09-27 DIAGNOSIS — Z9229 Personal history of other drug therapy: Secondary | ICD-10-CM | POA: Diagnosis not present

## 2021-09-27 DIAGNOSIS — C4491 Basal cell carcinoma of skin, unspecified: Secondary | ICD-10-CM | POA: Diagnosis not present

## 2021-09-27 DIAGNOSIS — Z5112 Encounter for antineoplastic immunotherapy: Secondary | ICD-10-CM | POA: Diagnosis not present

## 2021-09-27 DIAGNOSIS — Z79899 Other long term (current) drug therapy: Secondary | ICD-10-CM | POA: Diagnosis not present

## 2021-10-18 DIAGNOSIS — C4492 Squamous cell carcinoma of skin, unspecified: Secondary | ICD-10-CM | POA: Diagnosis not present

## 2021-10-18 DIAGNOSIS — D649 Anemia, unspecified: Secondary | ICD-10-CM | POA: Diagnosis not present

## 2021-10-18 DIAGNOSIS — R161 Splenomegaly, not elsewhere classified: Secondary | ICD-10-CM | POA: Diagnosis not present

## 2021-10-18 DIAGNOSIS — Z9221 Personal history of antineoplastic chemotherapy: Secondary | ICD-10-CM | POA: Diagnosis not present

## 2021-10-18 DIAGNOSIS — C9110 Chronic lymphocytic leukemia of B-cell type not having achieved remission: Secondary | ICD-10-CM | POA: Diagnosis not present

## 2021-10-18 DIAGNOSIS — C9111 Chronic lymphocytic leukemia of B-cell type in remission: Secondary | ICD-10-CM | POA: Diagnosis not present

## 2021-10-18 DIAGNOSIS — Z923 Personal history of irradiation: Secondary | ICD-10-CM | POA: Diagnosis not present

## 2021-10-18 DIAGNOSIS — Z5112 Encounter for antineoplastic immunotherapy: Secondary | ICD-10-CM | POA: Diagnosis not present

## 2021-10-18 DIAGNOSIS — R590 Localized enlarged lymph nodes: Secondary | ICD-10-CM | POA: Diagnosis not present

## 2021-10-18 DIAGNOSIS — Z803 Family history of malignant neoplasm of breast: Secondary | ICD-10-CM | POA: Diagnosis not present

## 2021-10-18 DIAGNOSIS — Z1329 Encounter for screening for other suspected endocrine disorder: Secondary | ICD-10-CM | POA: Diagnosis not present

## 2021-11-08 DIAGNOSIS — C4491 Basal cell carcinoma of skin, unspecified: Secondary | ICD-10-CM | POA: Diagnosis not present

## 2021-11-08 DIAGNOSIS — Z1329 Encounter for screening for other suspected endocrine disorder: Secondary | ICD-10-CM | POA: Diagnosis not present

## 2021-11-08 DIAGNOSIS — Z9229 Personal history of other drug therapy: Secondary | ICD-10-CM | POA: Diagnosis not present

## 2021-11-08 DIAGNOSIS — Z79899 Other long term (current) drug therapy: Secondary | ICD-10-CM | POA: Diagnosis not present

## 2021-11-08 DIAGNOSIS — Z5112 Encounter for antineoplastic immunotherapy: Secondary | ICD-10-CM | POA: Diagnosis not present

## 2021-11-15 DIAGNOSIS — L570 Actinic keratosis: Secondary | ICD-10-CM | POA: Diagnosis not present

## 2021-11-15 DIAGNOSIS — L578 Other skin changes due to chronic exposure to nonionizing radiation: Secondary | ICD-10-CM | POA: Diagnosis not present

## 2021-11-15 DIAGNOSIS — Z85828 Personal history of other malignant neoplasm of skin: Secondary | ICD-10-CM | POA: Diagnosis not present

## 2021-11-15 DIAGNOSIS — R21 Rash and other nonspecific skin eruption: Secondary | ICD-10-CM | POA: Diagnosis not present

## 2021-11-29 DIAGNOSIS — C9111 Chronic lymphocytic leukemia of B-cell type in remission: Secondary | ICD-10-CM | POA: Diagnosis not present

## 2021-11-29 DIAGNOSIS — Z79899 Other long term (current) drug therapy: Secondary | ICD-10-CM | POA: Diagnosis not present

## 2021-11-29 DIAGNOSIS — C801 Malignant (primary) neoplasm, unspecified: Secondary | ICD-10-CM | POA: Diagnosis not present

## 2021-11-29 DIAGNOSIS — R161 Splenomegaly, not elsewhere classified: Secondary | ICD-10-CM | POA: Diagnosis not present

## 2021-11-29 DIAGNOSIS — Z803 Family history of malignant neoplasm of breast: Secondary | ICD-10-CM | POA: Diagnosis not present

## 2021-11-29 DIAGNOSIS — C4491 Basal cell carcinoma of skin, unspecified: Secondary | ICD-10-CM | POA: Diagnosis not present

## 2021-11-29 DIAGNOSIS — D649 Anemia, unspecified: Secondary | ICD-10-CM | POA: Diagnosis not present

## 2021-11-29 DIAGNOSIS — Z9229 Personal history of other drug therapy: Secondary | ICD-10-CM | POA: Diagnosis not present

## 2021-11-29 DIAGNOSIS — Z5112 Encounter for antineoplastic immunotherapy: Secondary | ICD-10-CM | POA: Diagnosis not present

## 2021-11-29 DIAGNOSIS — Z1329 Encounter for screening for other suspected endocrine disorder: Secondary | ICD-10-CM | POA: Diagnosis not present

## 2021-11-29 DIAGNOSIS — R590 Localized enlarged lymph nodes: Secondary | ICD-10-CM | POA: Diagnosis not present

## 2021-12-20 DIAGNOSIS — C4491 Basal cell carcinoma of skin, unspecified: Secondary | ICD-10-CM | POA: Diagnosis not present

## 2021-12-20 DIAGNOSIS — Z9229 Personal history of other drug therapy: Secondary | ICD-10-CM | POA: Diagnosis not present

## 2021-12-20 DIAGNOSIS — Z79899 Other long term (current) drug therapy: Secondary | ICD-10-CM | POA: Diagnosis not present

## 2021-12-20 DIAGNOSIS — Z5112 Encounter for antineoplastic immunotherapy: Secondary | ICD-10-CM | POA: Diagnosis not present

## 2021-12-20 DIAGNOSIS — Z1329 Encounter for screening for other suspected endocrine disorder: Secondary | ICD-10-CM | POA: Diagnosis not present

## 2022-01-17 DIAGNOSIS — C801 Malignant (primary) neoplasm, unspecified: Secondary | ICD-10-CM | POA: Diagnosis not present

## 2022-01-17 DIAGNOSIS — Z5112 Encounter for antineoplastic immunotherapy: Secondary | ICD-10-CM | POA: Diagnosis not present

## 2022-01-17 DIAGNOSIS — C9110 Chronic lymphocytic leukemia of B-cell type not having achieved remission: Secondary | ICD-10-CM | POA: Diagnosis not present

## 2022-01-17 DIAGNOSIS — Z9221 Personal history of antineoplastic chemotherapy: Secondary | ICD-10-CM | POA: Diagnosis not present

## 2022-01-17 DIAGNOSIS — Z923 Personal history of irradiation: Secondary | ICD-10-CM | POA: Diagnosis not present

## 2022-01-17 DIAGNOSIS — Z79890 Hormone replacement therapy (postmenopausal): Secondary | ICD-10-CM | POA: Diagnosis not present

## 2022-01-17 DIAGNOSIS — Z1329 Encounter for screening for other suspected endocrine disorder: Secondary | ICD-10-CM | POA: Diagnosis not present

## 2022-01-17 DIAGNOSIS — C44321 Squamous cell carcinoma of skin of nose: Secondary | ICD-10-CM | POA: Diagnosis not present

## 2022-01-17 DIAGNOSIS — Z79899 Other long term (current) drug therapy: Secondary | ICD-10-CM | POA: Diagnosis not present

## 2022-01-17 DIAGNOSIS — Z9229 Personal history of other drug therapy: Secondary | ICD-10-CM | POA: Diagnosis not present

## 2022-01-17 DIAGNOSIS — C4491 Basal cell carcinoma of skin, unspecified: Secondary | ICD-10-CM | POA: Diagnosis not present

## 2022-02-06 DIAGNOSIS — Z5112 Encounter for antineoplastic immunotherapy: Secondary | ICD-10-CM | POA: Diagnosis not present

## 2022-02-06 DIAGNOSIS — Z9229 Personal history of other drug therapy: Secondary | ICD-10-CM | POA: Diagnosis not present

## 2022-02-06 DIAGNOSIS — Z79899 Other long term (current) drug therapy: Secondary | ICD-10-CM | POA: Diagnosis not present

## 2022-02-06 DIAGNOSIS — C4491 Basal cell carcinoma of skin, unspecified: Secondary | ICD-10-CM | POA: Diagnosis not present

## 2022-02-06 DIAGNOSIS — Z1329 Encounter for screening for other suspected endocrine disorder: Secondary | ICD-10-CM | POA: Diagnosis not present

## 2022-02-27 DIAGNOSIS — C4492 Squamous cell carcinoma of skin, unspecified: Secondary | ICD-10-CM | POA: Diagnosis not present

## 2022-02-27 DIAGNOSIS — C9111 Chronic lymphocytic leukemia of B-cell type in remission: Secondary | ICD-10-CM | POA: Diagnosis not present

## 2022-02-28 DIAGNOSIS — Z5112 Encounter for antineoplastic immunotherapy: Secondary | ICD-10-CM | POA: Diagnosis not present

## 2022-02-28 DIAGNOSIS — Z9221 Personal history of antineoplastic chemotherapy: Secondary | ICD-10-CM | POA: Diagnosis not present

## 2022-02-28 DIAGNOSIS — C44321 Squamous cell carcinoma of skin of nose: Secondary | ICD-10-CM | POA: Diagnosis not present

## 2022-02-28 DIAGNOSIS — Z634 Disappearance and death of family member: Secondary | ICD-10-CM | POA: Diagnosis not present

## 2022-02-28 DIAGNOSIS — Z923 Personal history of irradiation: Secondary | ICD-10-CM | POA: Diagnosis not present

## 2022-02-28 DIAGNOSIS — Z9229 Personal history of other drug therapy: Secondary | ICD-10-CM | POA: Diagnosis not present

## 2022-02-28 DIAGNOSIS — R590 Localized enlarged lymph nodes: Secondary | ICD-10-CM | POA: Diagnosis not present

## 2022-02-28 DIAGNOSIS — Z79899 Other long term (current) drug therapy: Secondary | ICD-10-CM | POA: Diagnosis not present

## 2022-02-28 DIAGNOSIS — C4491 Basal cell carcinoma of skin, unspecified: Secondary | ICD-10-CM | POA: Diagnosis not present

## 2022-02-28 DIAGNOSIS — Z1329 Encounter for screening for other suspected endocrine disorder: Secondary | ICD-10-CM | POA: Diagnosis not present

## 2022-02-28 DIAGNOSIS — C9110 Chronic lymphocytic leukemia of B-cell type not having achieved remission: Secondary | ICD-10-CM | POA: Diagnosis not present

## 2022-02-28 DIAGNOSIS — Z803 Family history of malignant neoplasm of breast: Secondary | ICD-10-CM | POA: Diagnosis not present

## 2022-02-28 DIAGNOSIS — C801 Malignant (primary) neoplasm, unspecified: Secondary | ICD-10-CM | POA: Diagnosis not present

## 2022-03-07 DIAGNOSIS — D492 Neoplasm of unspecified behavior of bone, soft tissue, and skin: Secondary | ICD-10-CM | POA: Diagnosis not present

## 2022-03-07 DIAGNOSIS — L578 Other skin changes due to chronic exposure to nonionizing radiation: Secondary | ICD-10-CM | POA: Diagnosis not present

## 2022-03-07 DIAGNOSIS — L98499 Non-pressure chronic ulcer of skin of other sites with unspecified severity: Secondary | ICD-10-CM | POA: Diagnosis not present

## 2022-03-07 DIAGNOSIS — L570 Actinic keratosis: Secondary | ICD-10-CM | POA: Diagnosis not present

## 2022-03-07 DIAGNOSIS — C760 Malignant neoplasm of head, face and neck: Secondary | ICD-10-CM | POA: Diagnosis not present

## 2022-03-07 DIAGNOSIS — Z85828 Personal history of other malignant neoplasm of skin: Secondary | ICD-10-CM | POA: Diagnosis not present

## 2022-03-07 DIAGNOSIS — L986 Other infiltrative disorders of the skin and subcutaneous tissue: Secondary | ICD-10-CM | POA: Diagnosis not present

## 2022-03-07 DIAGNOSIS — L814 Other melanin hyperpigmentation: Secondary | ICD-10-CM | POA: Diagnosis not present

## 2022-03-07 DIAGNOSIS — L821 Other seborrheic keratosis: Secondary | ICD-10-CM | POA: Diagnosis not present

## 2022-03-21 DIAGNOSIS — C801 Malignant (primary) neoplasm, unspecified: Secondary | ICD-10-CM | POA: Diagnosis not present

## 2022-03-21 DIAGNOSIS — Z79899 Other long term (current) drug therapy: Secondary | ICD-10-CM | POA: Diagnosis not present

## 2022-03-21 DIAGNOSIS — Z1329 Encounter for screening for other suspected endocrine disorder: Secondary | ICD-10-CM | POA: Diagnosis not present

## 2022-03-21 DIAGNOSIS — C4491 Basal cell carcinoma of skin, unspecified: Secondary | ICD-10-CM | POA: Diagnosis not present

## 2022-03-21 DIAGNOSIS — Z5112 Encounter for antineoplastic immunotherapy: Secondary | ICD-10-CM | POA: Diagnosis not present

## 2022-04-08 DIAGNOSIS — C801 Malignant (primary) neoplasm, unspecified: Secondary | ICD-10-CM | POA: Diagnosis not present

## 2022-04-08 DIAGNOSIS — Z79899 Other long term (current) drug therapy: Secondary | ICD-10-CM | POA: Diagnosis not present

## 2022-04-08 DIAGNOSIS — C4491 Basal cell carcinoma of skin, unspecified: Secondary | ICD-10-CM | POA: Diagnosis not present

## 2022-04-08 DIAGNOSIS — Z1329 Encounter for screening for other suspected endocrine disorder: Secondary | ICD-10-CM | POA: Diagnosis not present

## 2022-04-08 DIAGNOSIS — R161 Splenomegaly, not elsewhere classified: Secondary | ICD-10-CM | POA: Diagnosis not present

## 2022-04-08 DIAGNOSIS — Z9221 Personal history of antineoplastic chemotherapy: Secondary | ICD-10-CM | POA: Diagnosis not present

## 2022-04-08 DIAGNOSIS — L299 Pruritus, unspecified: Secondary | ICD-10-CM | POA: Diagnosis not present

## 2022-04-08 DIAGNOSIS — Z5112 Encounter for antineoplastic immunotherapy: Secondary | ICD-10-CM | POA: Diagnosis not present

## 2022-04-08 DIAGNOSIS — C9110 Chronic lymphocytic leukemia of B-cell type not having achieved remission: Secondary | ICD-10-CM | POA: Diagnosis not present

## 2022-04-08 DIAGNOSIS — R590 Localized enlarged lymph nodes: Secondary | ICD-10-CM | POA: Diagnosis not present

## 2022-04-08 DIAGNOSIS — Z923 Personal history of irradiation: Secondary | ICD-10-CM | POA: Diagnosis not present

## 2022-04-08 DIAGNOSIS — Z9229 Personal history of other drug therapy: Secondary | ICD-10-CM | POA: Diagnosis not present

## 2022-05-03 DIAGNOSIS — Z5112 Encounter for antineoplastic immunotherapy: Secondary | ICD-10-CM | POA: Diagnosis not present

## 2022-05-03 DIAGNOSIS — Z9229 Personal history of other drug therapy: Secondary | ICD-10-CM | POA: Diagnosis not present

## 2022-05-03 DIAGNOSIS — Z1329 Encounter for screening for other suspected endocrine disorder: Secondary | ICD-10-CM | POA: Diagnosis not present

## 2022-05-03 DIAGNOSIS — Z79899 Other long term (current) drug therapy: Secondary | ICD-10-CM | POA: Diagnosis not present

## 2022-05-03 DIAGNOSIS — C4491 Basal cell carcinoma of skin, unspecified: Secondary | ICD-10-CM | POA: Diagnosis not present

## 2022-05-03 DIAGNOSIS — C801 Malignant (primary) neoplasm, unspecified: Secondary | ICD-10-CM | POA: Diagnosis not present

## 2022-05-16 DIAGNOSIS — Z85828 Personal history of other malignant neoplasm of skin: Secondary | ICD-10-CM | POA: Diagnosis not present

## 2022-05-16 DIAGNOSIS — B029 Zoster without complications: Secondary | ICD-10-CM | POA: Diagnosis not present

## 2022-05-16 DIAGNOSIS — L578 Other skin changes due to chronic exposure to nonionizing radiation: Secondary | ICD-10-CM | POA: Diagnosis not present

## 2022-05-23 DIAGNOSIS — C9111 Chronic lymphocytic leukemia of B-cell type in remission: Secondary | ICD-10-CM | POA: Diagnosis not present

## 2022-05-23 DIAGNOSIS — C4491 Basal cell carcinoma of skin, unspecified: Secondary | ICD-10-CM | POA: Diagnosis not present

## 2022-05-23 DIAGNOSIS — Z9229 Personal history of other drug therapy: Secondary | ICD-10-CM | POA: Diagnosis not present

## 2022-05-23 DIAGNOSIS — C4492 Squamous cell carcinoma of skin, unspecified: Secondary | ICD-10-CM | POA: Diagnosis not present

## 2022-05-23 DIAGNOSIS — Z79899 Other long term (current) drug therapy: Secondary | ICD-10-CM | POA: Diagnosis not present

## 2022-05-23 DIAGNOSIS — Z1329 Encounter for screening for other suspected endocrine disorder: Secondary | ICD-10-CM | POA: Diagnosis not present

## 2022-05-23 DIAGNOSIS — C801 Malignant (primary) neoplasm, unspecified: Secondary | ICD-10-CM | POA: Diagnosis not present

## 2022-06-01 DIAGNOSIS — K683 Retroperitoneal hematoma: Secondary | ICD-10-CM | POA: Diagnosis not present

## 2022-06-01 DIAGNOSIS — Z5181 Encounter for therapeutic drug level monitoring: Secondary | ICD-10-CM | POA: Diagnosis not present

## 2022-06-01 DIAGNOSIS — R590 Localized enlarged lymph nodes: Secondary | ICD-10-CM | POA: Diagnosis not present

## 2022-06-01 DIAGNOSIS — C4491 Basal cell carcinoma of skin, unspecified: Secondary | ICD-10-CM | POA: Diagnosis not present

## 2022-06-01 DIAGNOSIS — J690 Pneumonitis due to inhalation of food and vomit: Secondary | ICD-10-CM | POA: Diagnosis not present

## 2022-06-01 DIAGNOSIS — D0439 Carcinoma in situ of skin of other parts of face: Secondary | ICD-10-CM | POA: Diagnosis not present

## 2022-06-01 DIAGNOSIS — K828 Other specified diseases of gallbladder: Secondary | ICD-10-CM | POA: Diagnosis not present

## 2022-06-01 DIAGNOSIS — J90 Pleural effusion, not elsewhere classified: Secondary | ICD-10-CM | POA: Diagnosis not present

## 2022-06-01 DIAGNOSIS — C44321 Squamous cell carcinoma of skin of nose: Secondary | ICD-10-CM | POA: Diagnosis not present

## 2022-06-01 DIAGNOSIS — Z9981 Dependence on supplemental oxygen: Secondary | ICD-10-CM | POA: Diagnosis not present

## 2022-06-01 DIAGNOSIS — R600 Localized edema: Secondary | ICD-10-CM | POA: Diagnosis not present

## 2022-06-01 DIAGNOSIS — R571 Hypovolemic shock: Secondary | ICD-10-CM | POA: Diagnosis not present

## 2022-06-01 DIAGNOSIS — D699 Hemorrhagic condition, unspecified: Secondary | ICD-10-CM | POA: Diagnosis not present

## 2022-06-01 DIAGNOSIS — Z9889 Other specified postprocedural states: Secondary | ICD-10-CM | POA: Diagnosis not present

## 2022-06-01 DIAGNOSIS — R0609 Other forms of dyspnea: Secondary | ICD-10-CM | POA: Diagnosis not present

## 2022-06-01 DIAGNOSIS — Z85828 Personal history of other malignant neoplasm of skin: Secondary | ICD-10-CM | POA: Diagnosis not present

## 2022-06-01 DIAGNOSIS — D691 Qualitative platelet defects: Secondary | ICD-10-CM | POA: Diagnosis not present

## 2022-06-01 DIAGNOSIS — M6281 Muscle weakness (generalized): Secondary | ICD-10-CM | POA: Diagnosis not present

## 2022-06-01 DIAGNOSIS — R601 Generalized edema: Secondary | ICD-10-CM | POA: Diagnosis not present

## 2022-06-01 DIAGNOSIS — K261 Acute duodenal ulcer with perforation: Secondary | ICD-10-CM | POA: Diagnosis not present

## 2022-06-01 DIAGNOSIS — R279 Unspecified lack of coordination: Secondary | ICD-10-CM | POA: Diagnosis not present

## 2022-06-01 DIAGNOSIS — K2960 Other gastritis without bleeding: Secondary | ICD-10-CM | POA: Diagnosis not present

## 2022-06-01 DIAGNOSIS — C9100 Acute lymphoblastic leukemia not having achieved remission: Secondary | ICD-10-CM | POA: Diagnosis not present

## 2022-06-01 DIAGNOSIS — J9601 Acute respiratory failure with hypoxia: Secondary | ICD-10-CM | POA: Diagnosis not present

## 2022-06-01 DIAGNOSIS — T451X5A Adverse effect of antineoplastic and immunosuppressive drugs, initial encounter: Secondary | ICD-10-CM | POA: Diagnosis not present

## 2022-06-01 DIAGNOSIS — I9589 Other hypotension: Secondary | ICD-10-CM | POA: Diagnosis not present

## 2022-06-01 DIAGNOSIS — D688 Other specified coagulation defects: Secondary | ICD-10-CM | POA: Diagnosis not present

## 2022-06-01 DIAGNOSIS — D72829 Elevated white blood cell count, unspecified: Secondary | ICD-10-CM | POA: Diagnosis not present

## 2022-06-01 DIAGNOSIS — K560 Paralytic ileus: Secondary | ICD-10-CM | POA: Diagnosis not present

## 2022-06-01 DIAGNOSIS — I77810 Thoracic aortic ectasia: Secondary | ICD-10-CM | POA: Diagnosis not present

## 2022-06-01 DIAGNOSIS — R530 Neoplastic (malignant) related fatigue: Secondary | ICD-10-CM | POA: Diagnosis not present

## 2022-06-01 DIAGNOSIS — R609 Edema, unspecified: Secondary | ICD-10-CM | POA: Diagnosis not present

## 2022-06-01 DIAGNOSIS — R188 Other ascites: Secondary | ICD-10-CM | POA: Diagnosis not present

## 2022-06-01 DIAGNOSIS — D62 Acute posthemorrhagic anemia: Secondary | ICD-10-CM | POA: Diagnosis not present

## 2022-06-01 DIAGNOSIS — E039 Hypothyroidism, unspecified: Secondary | ICD-10-CM | POA: Diagnosis not present

## 2022-06-01 DIAGNOSIS — R7401 Elevation of levels of liver transaminase levels: Secondary | ICD-10-CM | POA: Diagnosis not present

## 2022-06-01 DIAGNOSIS — R109 Unspecified abdominal pain: Secondary | ICD-10-CM | POA: Diagnosis not present

## 2022-06-01 DIAGNOSIS — D696 Thrombocytopenia, unspecified: Secondary | ICD-10-CM | POA: Diagnosis not present

## 2022-06-01 DIAGNOSIS — R161 Splenomegaly, not elsewhere classified: Secondary | ICD-10-CM | POA: Diagnosis not present

## 2022-06-01 DIAGNOSIS — K5900 Constipation, unspecified: Secondary | ICD-10-CM | POA: Diagnosis not present

## 2022-06-01 DIAGNOSIS — J9602 Acute respiratory failure with hypercapnia: Secondary | ICD-10-CM | POA: Diagnosis not present

## 2022-06-01 DIAGNOSIS — D849 Immunodeficiency, unspecified: Secondary | ICD-10-CM | POA: Diagnosis not present

## 2022-06-01 DIAGNOSIS — C9111 Chronic lymphocytic leukemia of B-cell type in remission: Secondary | ICD-10-CM | POA: Diagnosis not present

## 2022-06-01 DIAGNOSIS — R579 Shock, unspecified: Secondary | ICD-10-CM | POA: Diagnosis not present

## 2022-06-01 DIAGNOSIS — N281 Cyst of kidney, acquired: Secondary | ICD-10-CM | POA: Diagnosis not present

## 2022-06-01 DIAGNOSIS — R918 Other nonspecific abnormal finding of lung field: Secondary | ICD-10-CM | POA: Diagnosis not present

## 2022-06-01 DIAGNOSIS — A499 Bacterial infection, unspecified: Secondary | ICD-10-CM | POA: Diagnosis not present

## 2022-06-01 DIAGNOSIS — N170 Acute kidney failure with tubular necrosis: Secondary | ICD-10-CM | POA: Diagnosis not present

## 2022-06-01 DIAGNOSIS — D61818 Other pancytopenia: Secondary | ICD-10-CM | POA: Diagnosis not present

## 2022-06-01 DIAGNOSIS — N186 End stage renal disease: Secondary | ICD-10-CM | POA: Diagnosis not present

## 2022-06-01 DIAGNOSIS — K265 Chronic or unspecified duodenal ulcer with perforation: Secondary | ICD-10-CM | POA: Diagnosis not present

## 2022-06-01 DIAGNOSIS — M3581 Multisystem inflammatory syndrome: Secondary | ICD-10-CM | POA: Diagnosis not present

## 2022-06-01 DIAGNOSIS — L270 Generalized skin eruption due to drugs and medicaments taken internally: Secondary | ICD-10-CM | POA: Diagnosis not present

## 2022-06-01 DIAGNOSIS — Z789 Other specified health status: Secondary | ICD-10-CM | POA: Diagnosis not present

## 2022-06-01 DIAGNOSIS — I824Z2 Acute embolism and thrombosis of unspecified deep veins of left distal lower extremity: Secondary | ICD-10-CM | POA: Diagnosis not present

## 2022-06-01 DIAGNOSIS — C801 Malignant (primary) neoplasm, unspecified: Secondary | ICD-10-CM | POA: Diagnosis not present

## 2022-06-01 DIAGNOSIS — Z452 Encounter for adjustment and management of vascular access device: Secondary | ICD-10-CM | POA: Diagnosis not present

## 2022-06-01 DIAGNOSIS — Z79899 Other long term (current) drug therapy: Secondary | ICD-10-CM | POA: Diagnosis not present

## 2022-06-01 DIAGNOSIS — Z20822 Contact with and (suspected) exposure to covid-19: Secondary | ICD-10-CM | POA: Diagnosis not present

## 2022-06-01 DIAGNOSIS — R0902 Hypoxemia: Secondary | ICD-10-CM | POA: Diagnosis not present

## 2022-06-01 DIAGNOSIS — I959 Hypotension, unspecified: Secondary | ICD-10-CM | POA: Diagnosis not present

## 2022-06-01 DIAGNOSIS — E278 Other specified disorders of adrenal gland: Secondary | ICD-10-CM | POA: Diagnosis not present

## 2022-06-01 DIAGNOSIS — I82462 Acute embolism and thrombosis of left calf muscular vein: Secondary | ICD-10-CM | POA: Diagnosis not present

## 2022-06-01 DIAGNOSIS — R748 Abnormal levels of other serum enzymes: Secondary | ICD-10-CM | POA: Diagnosis not present

## 2022-06-01 DIAGNOSIS — R5081 Fever presenting with conditions classified elsewhere: Secondary | ICD-10-CM | POA: Diagnosis not present

## 2022-06-01 DIAGNOSIS — R578 Other shock: Secondary | ICD-10-CM | POA: Diagnosis not present

## 2022-06-01 DIAGNOSIS — D761 Hemophagocytic lymphohistiocytosis: Secondary | ICD-10-CM | POA: Diagnosis not present

## 2022-06-01 DIAGNOSIS — N1330 Unspecified hydronephrosis: Secondary | ICD-10-CM | POA: Diagnosis not present

## 2022-06-01 DIAGNOSIS — R509 Fever, unspecified: Secondary | ICD-10-CM | POA: Diagnosis not present

## 2022-06-01 DIAGNOSIS — R140 Abdominal distension (gaseous): Secondary | ICD-10-CM | POA: Diagnosis not present

## 2022-06-01 DIAGNOSIS — C9190 Lymphoid leukemia, unspecified not having achieved remission: Secondary | ICD-10-CM | POA: Diagnosis not present

## 2022-06-01 DIAGNOSIS — K689 Other disorders of retroperitoneum: Secondary | ICD-10-CM | POA: Diagnosis not present

## 2022-06-01 DIAGNOSIS — K631 Perforation of intestine (nontraumatic): Secondary | ICD-10-CM | POA: Diagnosis not present

## 2022-06-01 DIAGNOSIS — N189 Chronic kidney disease, unspecified: Secondary | ICD-10-CM | POA: Diagnosis not present

## 2022-06-01 DIAGNOSIS — R059 Cough, unspecified: Secondary | ICD-10-CM | POA: Diagnosis not present

## 2022-06-01 DIAGNOSIS — D689 Coagulation defect, unspecified: Secondary | ICD-10-CM | POA: Diagnosis not present

## 2022-06-01 DIAGNOSIS — K922 Gastrointestinal hemorrhage, unspecified: Secondary | ICD-10-CM | POA: Diagnosis not present

## 2022-06-01 DIAGNOSIS — D649 Anemia, unspecified: Secondary | ICD-10-CM | POA: Diagnosis not present

## 2022-06-01 DIAGNOSIS — E875 Hyperkalemia: Secondary | ICD-10-CM | POA: Diagnosis not present

## 2022-06-01 DIAGNOSIS — I82C11 Acute embolism and thrombosis of right internal jugular vein: Secondary | ICD-10-CM | POA: Diagnosis not present

## 2022-06-01 DIAGNOSIS — E8721 Acute metabolic acidosis: Secondary | ICD-10-CM | POA: Diagnosis not present

## 2022-06-01 DIAGNOSIS — Z9911 Dependence on respirator [ventilator] status: Secondary | ICD-10-CM | POA: Diagnosis not present

## 2022-06-01 DIAGNOSIS — K56609 Unspecified intestinal obstruction, unspecified as to partial versus complete obstruction: Secondary | ICD-10-CM | POA: Diagnosis not present

## 2022-06-01 DIAGNOSIS — D61810 Antineoplastic chemotherapy induced pancytopenia: Secondary | ICD-10-CM | POA: Diagnosis not present

## 2022-06-01 DIAGNOSIS — R162 Hepatomegaly with splenomegaly, not elsewhere classified: Secondary | ICD-10-CM | POA: Diagnosis not present

## 2022-06-01 DIAGNOSIS — E8729 Other acidosis: Secondary | ICD-10-CM | POA: Diagnosis not present

## 2022-06-01 DIAGNOSIS — J811 Chronic pulmonary edema: Secondary | ICD-10-CM | POA: Diagnosis not present

## 2022-06-01 DIAGNOSIS — D0422 Carcinoma in situ of skin of left ear and external auricular canal: Secondary | ICD-10-CM | POA: Diagnosis not present

## 2022-06-01 DIAGNOSIS — R5381 Other malaise: Secondary | ICD-10-CM | POA: Diagnosis not present

## 2022-06-01 DIAGNOSIS — N179 Acute kidney failure, unspecified: Secondary | ICD-10-CM | POA: Diagnosis not present

## 2022-06-01 DIAGNOSIS — C9110 Chronic lymphocytic leukemia of B-cell type not having achieved remission: Secondary | ICD-10-CM | POA: Diagnosis not present

## 2022-06-01 DIAGNOSIS — J9691 Respiratory failure, unspecified with hypoxia: Secondary | ICD-10-CM | POA: Diagnosis not present

## 2022-06-01 DIAGNOSIS — E43 Unspecified severe protein-calorie malnutrition: Secondary | ICD-10-CM | POA: Diagnosis not present

## 2022-06-01 DIAGNOSIS — K7200 Acute and subacute hepatic failure without coma: Secondary | ICD-10-CM | POA: Diagnosis not present

## 2022-06-01 DIAGNOSIS — Z7952 Long term (current) use of systemic steroids: Secondary | ICD-10-CM | POA: Diagnosis not present

## 2022-06-01 DIAGNOSIS — R0600 Dyspnea, unspecified: Secondary | ICD-10-CM | POA: Diagnosis not present

## 2022-06-01 DIAGNOSIS — E871 Hypo-osmolality and hyponatremia: Secondary | ICD-10-CM | POA: Diagnosis not present

## 2022-06-01 DIAGNOSIS — Z7409 Other reduced mobility: Secondary | ICD-10-CM | POA: Diagnosis not present

## 2022-06-01 DIAGNOSIS — G893 Neoplasm related pain (acute) (chronic): Secondary | ICD-10-CM | POA: Diagnosis not present

## 2022-06-01 DIAGNOSIS — K668 Other specified disorders of peritoneum: Secondary | ICD-10-CM | POA: Diagnosis not present

## 2022-06-01 DIAGNOSIS — A419 Sepsis, unspecified organism: Secondary | ICD-10-CM | POA: Diagnosis not present

## 2022-06-01 DIAGNOSIS — A0472 Enterocolitis due to Clostridium difficile, not specified as recurrent: Secondary | ICD-10-CM | POA: Diagnosis not present

## 2022-06-01 DIAGNOSIS — D84821 Immunodeficiency due to drugs: Secondary | ICD-10-CM | POA: Diagnosis not present

## 2022-06-01 DIAGNOSIS — Z4682 Encounter for fitting and adjustment of non-vascular catheter: Secondary | ICD-10-CM | POA: Diagnosis not present

## 2022-06-01 DIAGNOSIS — R7989 Other specified abnormal findings of blood chemistry: Secondary | ICD-10-CM | POA: Diagnosis not present

## 2022-06-01 DIAGNOSIS — R21 Rash and other nonspecific skin eruption: Secondary | ICD-10-CM | POA: Diagnosis not present

## 2022-06-01 DIAGNOSIS — J9811 Atelectasis: Secondary | ICD-10-CM | POA: Diagnosis not present

## 2022-06-01 DIAGNOSIS — R6521 Severe sepsis with septic shock: Secondary | ICD-10-CM | POA: Diagnosis not present

## 2022-06-01 DIAGNOSIS — D65 Disseminated intravascular coagulation [defibrination syndrome]: Secondary | ICD-10-CM | POA: Diagnosis not present

## 2022-06-01 DIAGNOSIS — R079 Chest pain, unspecified: Secondary | ICD-10-CM | POA: Diagnosis not present

## 2022-06-01 DIAGNOSIS — R5383 Other fatigue: Secondary | ICD-10-CM | POA: Diagnosis not present

## 2022-06-01 DIAGNOSIS — Z0189 Encounter for other specified special examinations: Secondary | ICD-10-CM | POA: Diagnosis not present

## 2022-06-01 DIAGNOSIS — D509 Iron deficiency anemia, unspecified: Secondary | ICD-10-CM | POA: Diagnosis not present

## 2022-06-01 DIAGNOSIS — D682 Hereditary deficiency of other clotting factors: Secondary | ICD-10-CM | POA: Diagnosis not present

## 2022-06-02 DIAGNOSIS — D62 Acute posthemorrhagic anemia: Secondary | ICD-10-CM | POA: Diagnosis not present

## 2022-06-02 DIAGNOSIS — C9111 Chronic lymphocytic leukemia of B-cell type in remission: Secondary | ICD-10-CM | POA: Diagnosis not present

## 2022-06-02 DIAGNOSIS — E871 Hypo-osmolality and hyponatremia: Secondary | ICD-10-CM | POA: Diagnosis not present

## 2022-06-02 DIAGNOSIS — R7401 Elevation of levels of liver transaminase levels: Secondary | ICD-10-CM | POA: Diagnosis not present

## 2022-06-03 DIAGNOSIS — R7401 Elevation of levels of liver transaminase levels: Secondary | ICD-10-CM | POA: Diagnosis not present

## 2022-06-03 DIAGNOSIS — D62 Acute posthemorrhagic anemia: Secondary | ICD-10-CM | POA: Diagnosis not present

## 2022-06-03 DIAGNOSIS — K7200 Acute and subacute hepatic failure without coma: Secondary | ICD-10-CM | POA: Diagnosis not present

## 2022-06-03 DIAGNOSIS — C9110 Chronic lymphocytic leukemia of B-cell type not having achieved remission: Secondary | ICD-10-CM | POA: Diagnosis not present

## 2022-06-03 DIAGNOSIS — D61818 Other pancytopenia: Secondary | ICD-10-CM | POA: Diagnosis not present

## 2022-06-03 DIAGNOSIS — R7989 Other specified abnormal findings of blood chemistry: Secondary | ICD-10-CM | POA: Diagnosis not present

## 2022-06-04 DIAGNOSIS — R7989 Other specified abnormal findings of blood chemistry: Secondary | ICD-10-CM | POA: Diagnosis not present

## 2022-06-04 DIAGNOSIS — D649 Anemia, unspecified: Secondary | ICD-10-CM | POA: Diagnosis not present

## 2022-06-04 DIAGNOSIS — R509 Fever, unspecified: Secondary | ICD-10-CM | POA: Diagnosis not present

## 2022-06-04 DIAGNOSIS — E871 Hypo-osmolality and hyponatremia: Secondary | ICD-10-CM | POA: Diagnosis not present

## 2022-06-04 DIAGNOSIS — D62 Acute posthemorrhagic anemia: Secondary | ICD-10-CM | POA: Diagnosis not present

## 2022-06-04 DIAGNOSIS — C9111 Chronic lymphocytic leukemia of B-cell type in remission: Secondary | ICD-10-CM | POA: Diagnosis not present

## 2022-06-04 DIAGNOSIS — R7401 Elevation of levels of liver transaminase levels: Secondary | ICD-10-CM | POA: Diagnosis not present

## 2022-06-04 DIAGNOSIS — D509 Iron deficiency anemia, unspecified: Secondary | ICD-10-CM | POA: Diagnosis not present

## 2022-06-04 DIAGNOSIS — R748 Abnormal levels of other serum enzymes: Secondary | ICD-10-CM | POA: Diagnosis not present

## 2022-06-04 DIAGNOSIS — K7200 Acute and subacute hepatic failure without coma: Secondary | ICD-10-CM | POA: Diagnosis not present

## 2022-06-04 DIAGNOSIS — C9110 Chronic lymphocytic leukemia of B-cell type not having achieved remission: Secondary | ICD-10-CM | POA: Diagnosis not present

## 2022-06-04 DIAGNOSIS — I77810 Thoracic aortic ectasia: Secondary | ICD-10-CM | POA: Diagnosis not present

## 2022-06-05 DIAGNOSIS — R7401 Elevation of levels of liver transaminase levels: Secondary | ICD-10-CM | POA: Diagnosis not present

## 2022-06-05 DIAGNOSIS — C9190 Lymphoid leukemia, unspecified not having achieved remission: Secondary | ICD-10-CM | POA: Diagnosis not present

## 2022-06-05 DIAGNOSIS — R059 Cough, unspecified: Secondary | ICD-10-CM | POA: Diagnosis not present

## 2022-06-05 DIAGNOSIS — D62 Acute posthemorrhagic anemia: Secondary | ICD-10-CM | POA: Diagnosis not present

## 2022-06-05 DIAGNOSIS — E871 Hypo-osmolality and hyponatremia: Secondary | ICD-10-CM | POA: Diagnosis not present

## 2022-06-05 DIAGNOSIS — C44321 Squamous cell carcinoma of skin of nose: Secondary | ICD-10-CM | POA: Diagnosis not present

## 2022-06-05 DIAGNOSIS — K7200 Acute and subacute hepatic failure without coma: Secondary | ICD-10-CM | POA: Diagnosis not present

## 2022-06-05 DIAGNOSIS — D72829 Elevated white blood cell count, unspecified: Secondary | ICD-10-CM | POA: Diagnosis not present

## 2022-06-05 DIAGNOSIS — C9111 Chronic lymphocytic leukemia of B-cell type in remission: Secondary | ICD-10-CM | POA: Diagnosis not present

## 2022-06-05 DIAGNOSIS — N179 Acute kidney failure, unspecified: Secondary | ICD-10-CM | POA: Diagnosis not present

## 2022-06-06 DIAGNOSIS — R7401 Elevation of levels of liver transaminase levels: Secondary | ICD-10-CM | POA: Diagnosis not present

## 2022-06-06 DIAGNOSIS — N179 Acute kidney failure, unspecified: Secondary | ICD-10-CM | POA: Diagnosis not present

## 2022-06-06 DIAGNOSIS — N281 Cyst of kidney, acquired: Secondary | ICD-10-CM | POA: Diagnosis not present

## 2022-06-06 DIAGNOSIS — I959 Hypotension, unspecified: Secondary | ICD-10-CM | POA: Diagnosis not present

## 2022-06-06 DIAGNOSIS — C44321 Squamous cell carcinoma of skin of nose: Secondary | ICD-10-CM | POA: Diagnosis not present

## 2022-06-06 DIAGNOSIS — E871 Hypo-osmolality and hyponatremia: Secondary | ICD-10-CM | POA: Diagnosis not present

## 2022-06-06 DIAGNOSIS — C9110 Chronic lymphocytic leukemia of B-cell type not having achieved remission: Secondary | ICD-10-CM | POA: Diagnosis not present

## 2022-06-06 DIAGNOSIS — K683 Retroperitoneal hematoma: Secondary | ICD-10-CM | POA: Diagnosis not present

## 2022-06-06 DIAGNOSIS — D649 Anemia, unspecified: Secondary | ICD-10-CM | POA: Diagnosis not present

## 2022-06-06 DIAGNOSIS — K689 Other disorders of retroperitoneum: Secondary | ICD-10-CM | POA: Diagnosis not present

## 2022-06-06 DIAGNOSIS — D72829 Elevated white blood cell count, unspecified: Secondary | ICD-10-CM | POA: Diagnosis not present

## 2022-06-06 DIAGNOSIS — E278 Other specified disorders of adrenal gland: Secondary | ICD-10-CM | POA: Diagnosis not present

## 2022-06-06 DIAGNOSIS — R161 Splenomegaly, not elsewhere classified: Secondary | ICD-10-CM | POA: Diagnosis not present

## 2022-06-06 DIAGNOSIS — R509 Fever, unspecified: Secondary | ICD-10-CM | POA: Diagnosis not present

## 2022-06-06 DIAGNOSIS — R0902 Hypoxemia: Secondary | ICD-10-CM | POA: Diagnosis not present

## 2022-06-06 DIAGNOSIS — R748 Abnormal levels of other serum enzymes: Secondary | ICD-10-CM | POA: Diagnosis not present

## 2022-06-06 DIAGNOSIS — D688 Other specified coagulation defects: Secondary | ICD-10-CM | POA: Diagnosis not present

## 2022-06-07 DIAGNOSIS — K683 Retroperitoneal hematoma: Secondary | ICD-10-CM | POA: Diagnosis not present

## 2022-06-07 DIAGNOSIS — R578 Other shock: Secondary | ICD-10-CM | POA: Diagnosis not present

## 2022-06-07 DIAGNOSIS — N170 Acute kidney failure with tubular necrosis: Secondary | ICD-10-CM | POA: Diagnosis not present

## 2022-06-07 DIAGNOSIS — D61818 Other pancytopenia: Secondary | ICD-10-CM | POA: Diagnosis not present

## 2022-06-07 DIAGNOSIS — R109 Unspecified abdominal pain: Secondary | ICD-10-CM | POA: Diagnosis not present

## 2022-06-07 DIAGNOSIS — N179 Acute kidney failure, unspecified: Secondary | ICD-10-CM | POA: Diagnosis not present

## 2022-06-07 DIAGNOSIS — R7401 Elevation of levels of liver transaminase levels: Secondary | ICD-10-CM | POA: Diagnosis not present

## 2022-06-07 DIAGNOSIS — C9110 Chronic lymphocytic leukemia of B-cell type not having achieved remission: Secondary | ICD-10-CM | POA: Diagnosis not present

## 2022-06-07 DIAGNOSIS — N1330 Unspecified hydronephrosis: Secondary | ICD-10-CM | POA: Diagnosis not present

## 2022-06-07 DIAGNOSIS — E871 Hypo-osmolality and hyponatremia: Secondary | ICD-10-CM | POA: Diagnosis not present

## 2022-06-07 DIAGNOSIS — E8729 Other acidosis: Secondary | ICD-10-CM | POA: Diagnosis not present

## 2022-06-08 DIAGNOSIS — E871 Hypo-osmolality and hyponatremia: Secondary | ICD-10-CM | POA: Diagnosis not present

## 2022-06-08 DIAGNOSIS — N170 Acute kidney failure with tubular necrosis: Secondary | ICD-10-CM | POA: Diagnosis not present

## 2022-06-08 DIAGNOSIS — K7200 Acute and subacute hepatic failure without coma: Secondary | ICD-10-CM | POA: Diagnosis not present

## 2022-06-08 DIAGNOSIS — K683 Retroperitoneal hematoma: Secondary | ICD-10-CM | POA: Diagnosis not present

## 2022-06-08 DIAGNOSIS — E8721 Acute metabolic acidosis: Secondary | ICD-10-CM | POA: Diagnosis not present

## 2022-06-08 DIAGNOSIS — D689 Coagulation defect, unspecified: Secondary | ICD-10-CM | POA: Diagnosis not present

## 2022-06-08 DIAGNOSIS — N179 Acute kidney failure, unspecified: Secondary | ICD-10-CM | POA: Diagnosis not present

## 2022-06-08 DIAGNOSIS — I959 Hypotension, unspecified: Secondary | ICD-10-CM | POA: Diagnosis not present

## 2022-06-08 DIAGNOSIS — D61818 Other pancytopenia: Secondary | ICD-10-CM | POA: Diagnosis not present

## 2022-06-08 DIAGNOSIS — R7989 Other specified abnormal findings of blood chemistry: Secondary | ICD-10-CM | POA: Diagnosis not present

## 2022-06-09 DIAGNOSIS — N179 Acute kidney failure, unspecified: Secondary | ICD-10-CM | POA: Diagnosis not present

## 2022-06-09 DIAGNOSIS — R0902 Hypoxemia: Secondary | ICD-10-CM | POA: Diagnosis not present

## 2022-06-09 DIAGNOSIS — K56609 Unspecified intestinal obstruction, unspecified as to partial versus complete obstruction: Secondary | ICD-10-CM | POA: Diagnosis not present

## 2022-06-09 DIAGNOSIS — K922 Gastrointestinal hemorrhage, unspecified: Secondary | ICD-10-CM | POA: Diagnosis not present

## 2022-06-09 DIAGNOSIS — D689 Coagulation defect, unspecified: Secondary | ICD-10-CM | POA: Diagnosis not present

## 2022-06-09 DIAGNOSIS — E871 Hypo-osmolality and hyponatremia: Secondary | ICD-10-CM | POA: Diagnosis not present

## 2022-06-09 DIAGNOSIS — J90 Pleural effusion, not elsewhere classified: Secondary | ICD-10-CM | POA: Diagnosis not present

## 2022-06-09 DIAGNOSIS — N1330 Unspecified hydronephrosis: Secondary | ICD-10-CM | POA: Diagnosis not present

## 2022-06-09 DIAGNOSIS — J811 Chronic pulmonary edema: Secondary | ICD-10-CM | POA: Diagnosis not present

## 2022-06-09 DIAGNOSIS — K7200 Acute and subacute hepatic failure without coma: Secondary | ICD-10-CM | POA: Diagnosis not present

## 2022-06-09 DIAGNOSIS — N170 Acute kidney failure with tubular necrosis: Secondary | ICD-10-CM | POA: Diagnosis not present

## 2022-06-09 DIAGNOSIS — E8721 Acute metabolic acidosis: Secondary | ICD-10-CM | POA: Diagnosis not present

## 2022-06-09 DIAGNOSIS — Z452 Encounter for adjustment and management of vascular access device: Secondary | ICD-10-CM | POA: Diagnosis not present

## 2022-06-09 DIAGNOSIS — R188 Other ascites: Secondary | ICD-10-CM | POA: Diagnosis not present

## 2022-06-10 DIAGNOSIS — R0902 Hypoxemia: Secondary | ICD-10-CM | POA: Diagnosis not present

## 2022-06-10 DIAGNOSIS — J9811 Atelectasis: Secondary | ICD-10-CM | POA: Diagnosis not present

## 2022-06-10 DIAGNOSIS — E871 Hypo-osmolality and hyponatremia: Secondary | ICD-10-CM | POA: Diagnosis not present

## 2022-06-10 DIAGNOSIS — N170 Acute kidney failure with tubular necrosis: Secondary | ICD-10-CM | POA: Diagnosis not present

## 2022-06-10 DIAGNOSIS — K7200 Acute and subacute hepatic failure without coma: Secondary | ICD-10-CM | POA: Diagnosis not present

## 2022-06-10 DIAGNOSIS — R7401 Elevation of levels of liver transaminase levels: Secondary | ICD-10-CM | POA: Diagnosis not present

## 2022-06-10 DIAGNOSIS — N179 Acute kidney failure, unspecified: Secondary | ICD-10-CM | POA: Diagnosis not present

## 2022-06-10 DIAGNOSIS — C9100 Acute lymphoblastic leukemia not having achieved remission: Secondary | ICD-10-CM | POA: Diagnosis not present

## 2022-06-10 DIAGNOSIS — K683 Retroperitoneal hematoma: Secondary | ICD-10-CM | POA: Diagnosis not present

## 2022-06-10 DIAGNOSIS — J90 Pleural effusion, not elsewhere classified: Secondary | ICD-10-CM | POA: Diagnosis not present

## 2022-06-11 DIAGNOSIS — K683 Retroperitoneal hematoma: Secondary | ICD-10-CM | POA: Diagnosis not present

## 2022-06-11 DIAGNOSIS — D62 Acute posthemorrhagic anemia: Secondary | ICD-10-CM | POA: Diagnosis not present

## 2022-06-11 DIAGNOSIS — C9110 Chronic lymphocytic leukemia of B-cell type not having achieved remission: Secondary | ICD-10-CM | POA: Diagnosis not present

## 2022-06-11 DIAGNOSIS — K7200 Acute and subacute hepatic failure without coma: Secondary | ICD-10-CM | POA: Diagnosis not present

## 2022-06-11 DIAGNOSIS — N179 Acute kidney failure, unspecified: Secondary | ICD-10-CM | POA: Diagnosis not present

## 2022-06-11 DIAGNOSIS — N170 Acute kidney failure with tubular necrosis: Secondary | ICD-10-CM | POA: Diagnosis not present

## 2022-06-11 DIAGNOSIS — R600 Localized edema: Secondary | ICD-10-CM | POA: Diagnosis not present

## 2022-06-12 DIAGNOSIS — D61818 Other pancytopenia: Secondary | ICD-10-CM | POA: Diagnosis not present

## 2022-06-12 DIAGNOSIS — K7200 Acute and subacute hepatic failure without coma: Secondary | ICD-10-CM | POA: Diagnosis not present

## 2022-06-12 DIAGNOSIS — D62 Acute posthemorrhagic anemia: Secondary | ICD-10-CM | POA: Diagnosis not present

## 2022-06-12 DIAGNOSIS — K683 Retroperitoneal hematoma: Secondary | ICD-10-CM | POA: Diagnosis not present

## 2022-06-12 DIAGNOSIS — N170 Acute kidney failure with tubular necrosis: Secondary | ICD-10-CM | POA: Diagnosis not present

## 2022-06-12 DIAGNOSIS — D849 Immunodeficiency, unspecified: Secondary | ICD-10-CM | POA: Diagnosis not present

## 2022-06-12 DIAGNOSIS — R7401 Elevation of levels of liver transaminase levels: Secondary | ICD-10-CM | POA: Diagnosis not present

## 2022-06-12 DIAGNOSIS — C9110 Chronic lymphocytic leukemia of B-cell type not having achieved remission: Secondary | ICD-10-CM | POA: Diagnosis not present

## 2022-06-12 DIAGNOSIS — N179 Acute kidney failure, unspecified: Secondary | ICD-10-CM | POA: Diagnosis not present

## 2022-06-13 DIAGNOSIS — K683 Retroperitoneal hematoma: Secondary | ICD-10-CM | POA: Diagnosis not present

## 2022-06-13 DIAGNOSIS — I959 Hypotension, unspecified: Secondary | ICD-10-CM | POA: Diagnosis not present

## 2022-06-13 DIAGNOSIS — D62 Acute posthemorrhagic anemia: Secondary | ICD-10-CM | POA: Diagnosis not present

## 2022-06-13 DIAGNOSIS — N1330 Unspecified hydronephrosis: Secondary | ICD-10-CM | POA: Diagnosis not present

## 2022-06-13 DIAGNOSIS — K7200 Acute and subacute hepatic failure without coma: Secondary | ICD-10-CM | POA: Diagnosis not present

## 2022-06-13 DIAGNOSIS — C9110 Chronic lymphocytic leukemia of B-cell type not having achieved remission: Secondary | ICD-10-CM | POA: Diagnosis not present

## 2022-06-13 DIAGNOSIS — R601 Generalized edema: Secondary | ICD-10-CM | POA: Diagnosis not present

## 2022-06-13 DIAGNOSIS — N179 Acute kidney failure, unspecified: Secondary | ICD-10-CM | POA: Diagnosis not present

## 2022-06-13 DIAGNOSIS — N170 Acute kidney failure with tubular necrosis: Secondary | ICD-10-CM | POA: Diagnosis not present

## 2022-06-14 DIAGNOSIS — Z5181 Encounter for therapeutic drug level monitoring: Secondary | ICD-10-CM | POA: Diagnosis not present

## 2022-06-14 DIAGNOSIS — N170 Acute kidney failure with tubular necrosis: Secondary | ICD-10-CM | POA: Diagnosis not present

## 2022-06-14 DIAGNOSIS — C9110 Chronic lymphocytic leukemia of B-cell type not having achieved remission: Secondary | ICD-10-CM | POA: Diagnosis not present

## 2022-06-14 DIAGNOSIS — N1330 Unspecified hydronephrosis: Secondary | ICD-10-CM | POA: Diagnosis not present

## 2022-06-14 DIAGNOSIS — D62 Acute posthemorrhagic anemia: Secondary | ICD-10-CM | POA: Diagnosis not present

## 2022-06-14 DIAGNOSIS — R590 Localized enlarged lymph nodes: Secondary | ICD-10-CM | POA: Diagnosis not present

## 2022-06-14 DIAGNOSIS — K7200 Acute and subacute hepatic failure without coma: Secondary | ICD-10-CM | POA: Diagnosis not present

## 2022-06-14 DIAGNOSIS — K683 Retroperitoneal hematoma: Secondary | ICD-10-CM | POA: Diagnosis not present

## 2022-06-14 DIAGNOSIS — N179 Acute kidney failure, unspecified: Secondary | ICD-10-CM | POA: Diagnosis not present

## 2022-06-14 DIAGNOSIS — Z7952 Long term (current) use of systemic steroids: Secondary | ICD-10-CM | POA: Diagnosis not present

## 2022-06-15 DIAGNOSIS — D62 Acute posthemorrhagic anemia: Secondary | ICD-10-CM | POA: Diagnosis not present

## 2022-06-15 DIAGNOSIS — K683 Retroperitoneal hematoma: Secondary | ICD-10-CM | POA: Diagnosis not present

## 2022-06-15 DIAGNOSIS — K7200 Acute and subacute hepatic failure without coma: Secondary | ICD-10-CM | POA: Diagnosis not present

## 2022-06-15 DIAGNOSIS — N170 Acute kidney failure with tubular necrosis: Secondary | ICD-10-CM | POA: Diagnosis not present

## 2022-06-16 DIAGNOSIS — E875 Hyperkalemia: Secondary | ICD-10-CM | POA: Diagnosis not present

## 2022-06-16 DIAGNOSIS — E871 Hypo-osmolality and hyponatremia: Secondary | ICD-10-CM | POA: Diagnosis not present

## 2022-06-16 DIAGNOSIS — R7989 Other specified abnormal findings of blood chemistry: Secondary | ICD-10-CM | POA: Diagnosis not present

## 2022-06-16 DIAGNOSIS — R188 Other ascites: Secondary | ICD-10-CM | POA: Diagnosis not present

## 2022-06-16 DIAGNOSIS — R109 Unspecified abdominal pain: Secondary | ICD-10-CM | POA: Diagnosis not present

## 2022-06-16 DIAGNOSIS — R140 Abdominal distension (gaseous): Secondary | ICD-10-CM | POA: Diagnosis not present

## 2022-06-16 DIAGNOSIS — R161 Splenomegaly, not elsewhere classified: Secondary | ICD-10-CM | POA: Diagnosis not present

## 2022-06-16 DIAGNOSIS — N179 Acute kidney failure, unspecified: Secondary | ICD-10-CM | POA: Diagnosis not present

## 2022-06-16 DIAGNOSIS — J90 Pleural effusion, not elsewhere classified: Secondary | ICD-10-CM | POA: Diagnosis not present

## 2022-06-16 DIAGNOSIS — N1330 Unspecified hydronephrosis: Secondary | ICD-10-CM | POA: Diagnosis not present

## 2022-06-16 DIAGNOSIS — K683 Retroperitoneal hematoma: Secondary | ICD-10-CM | POA: Diagnosis not present

## 2022-06-17 DIAGNOSIS — I959 Hypotension, unspecified: Secondary | ICD-10-CM | POA: Diagnosis not present

## 2022-06-17 DIAGNOSIS — R748 Abnormal levels of other serum enzymes: Secondary | ICD-10-CM | POA: Diagnosis not present

## 2022-06-17 DIAGNOSIS — D62 Acute posthemorrhagic anemia: Secondary | ICD-10-CM | POA: Diagnosis not present

## 2022-06-17 DIAGNOSIS — N179 Acute kidney failure, unspecified: Secondary | ICD-10-CM | POA: Diagnosis not present

## 2022-06-17 DIAGNOSIS — K683 Retroperitoneal hematoma: Secondary | ICD-10-CM | POA: Diagnosis not present

## 2022-06-17 DIAGNOSIS — R509 Fever, unspecified: Secondary | ICD-10-CM | POA: Diagnosis not present

## 2022-06-17 DIAGNOSIS — E875 Hyperkalemia: Secondary | ICD-10-CM | POA: Diagnosis not present

## 2022-06-17 DIAGNOSIS — N170 Acute kidney failure with tubular necrosis: Secondary | ICD-10-CM | POA: Diagnosis not present

## 2022-06-17 DIAGNOSIS — D691 Qualitative platelet defects: Secondary | ICD-10-CM | POA: Diagnosis not present

## 2022-06-17 DIAGNOSIS — R7401 Elevation of levels of liver transaminase levels: Secondary | ICD-10-CM | POA: Diagnosis not present

## 2022-06-17 DIAGNOSIS — C9110 Chronic lymphocytic leukemia of B-cell type not having achieved remission: Secondary | ICD-10-CM | POA: Diagnosis not present

## 2022-06-18 DIAGNOSIS — R748 Abnormal levels of other serum enzymes: Secondary | ICD-10-CM | POA: Diagnosis not present

## 2022-06-18 DIAGNOSIS — R7989 Other specified abnormal findings of blood chemistry: Secondary | ICD-10-CM | POA: Diagnosis not present

## 2022-06-18 DIAGNOSIS — K560 Paralytic ileus: Secondary | ICD-10-CM | POA: Diagnosis not present

## 2022-06-18 DIAGNOSIS — I959 Hypotension, unspecified: Secondary | ICD-10-CM | POA: Diagnosis not present

## 2022-06-18 DIAGNOSIS — N179 Acute kidney failure, unspecified: Secondary | ICD-10-CM | POA: Diagnosis not present

## 2022-06-18 DIAGNOSIS — E871 Hypo-osmolality and hyponatremia: Secondary | ICD-10-CM | POA: Diagnosis not present

## 2022-06-18 DIAGNOSIS — N170 Acute kidney failure with tubular necrosis: Secondary | ICD-10-CM | POA: Diagnosis not present

## 2022-06-18 DIAGNOSIS — K683 Retroperitoneal hematoma: Secondary | ICD-10-CM | POA: Diagnosis not present

## 2022-06-18 DIAGNOSIS — R7401 Elevation of levels of liver transaminase levels: Secondary | ICD-10-CM | POA: Diagnosis not present

## 2022-06-18 DIAGNOSIS — D691 Qualitative platelet defects: Secondary | ICD-10-CM | POA: Diagnosis not present

## 2022-06-18 DIAGNOSIS — C9110 Chronic lymphocytic leukemia of B-cell type not having achieved remission: Secondary | ICD-10-CM | POA: Diagnosis not present

## 2022-06-18 DIAGNOSIS — D696 Thrombocytopenia, unspecified: Secondary | ICD-10-CM | POA: Diagnosis not present

## 2022-06-18 DIAGNOSIS — D649 Anemia, unspecified: Secondary | ICD-10-CM | POA: Diagnosis not present

## 2022-06-18 DIAGNOSIS — N1330 Unspecified hydronephrosis: Secondary | ICD-10-CM | POA: Diagnosis not present

## 2022-06-19 DIAGNOSIS — N179 Acute kidney failure, unspecified: Secondary | ICD-10-CM | POA: Diagnosis not present

## 2022-06-19 DIAGNOSIS — D689 Coagulation defect, unspecified: Secondary | ICD-10-CM | POA: Diagnosis not present

## 2022-06-19 DIAGNOSIS — D61818 Other pancytopenia: Secondary | ICD-10-CM | POA: Diagnosis not present

## 2022-06-19 DIAGNOSIS — N170 Acute kidney failure with tubular necrosis: Secondary | ICD-10-CM | POA: Diagnosis not present

## 2022-06-19 DIAGNOSIS — C801 Malignant (primary) neoplasm, unspecified: Secondary | ICD-10-CM | POA: Diagnosis not present

## 2022-06-19 DIAGNOSIS — K7200 Acute and subacute hepatic failure without coma: Secondary | ICD-10-CM | POA: Diagnosis not present

## 2022-06-19 DIAGNOSIS — C9110 Chronic lymphocytic leukemia of B-cell type not having achieved remission: Secondary | ICD-10-CM | POA: Diagnosis not present

## 2022-06-19 DIAGNOSIS — K683 Retroperitoneal hematoma: Secondary | ICD-10-CM | POA: Diagnosis not present

## 2022-06-19 DIAGNOSIS — D696 Thrombocytopenia, unspecified: Secondary | ICD-10-CM | POA: Diagnosis not present

## 2022-06-19 DIAGNOSIS — I9589 Other hypotension: Secondary | ICD-10-CM | POA: Diagnosis not present

## 2022-06-19 DIAGNOSIS — D649 Anemia, unspecified: Secondary | ICD-10-CM | POA: Diagnosis not present

## 2022-06-20 DIAGNOSIS — N170 Acute kidney failure with tubular necrosis: Secondary | ICD-10-CM | POA: Diagnosis not present

## 2022-06-20 DIAGNOSIS — C9110 Chronic lymphocytic leukemia of B-cell type not having achieved remission: Secondary | ICD-10-CM | POA: Diagnosis not present

## 2022-06-20 DIAGNOSIS — D696 Thrombocytopenia, unspecified: Secondary | ICD-10-CM | POA: Diagnosis not present

## 2022-06-20 DIAGNOSIS — K683 Retroperitoneal hematoma: Secondary | ICD-10-CM | POA: Diagnosis not present

## 2022-06-20 DIAGNOSIS — K7200 Acute and subacute hepatic failure without coma: Secondary | ICD-10-CM | POA: Diagnosis not present

## 2022-06-20 DIAGNOSIS — C801 Malignant (primary) neoplasm, unspecified: Secondary | ICD-10-CM | POA: Diagnosis not present

## 2022-06-20 DIAGNOSIS — D691 Qualitative platelet defects: Secondary | ICD-10-CM | POA: Diagnosis not present

## 2022-06-20 DIAGNOSIS — D649 Anemia, unspecified: Secondary | ICD-10-CM | POA: Diagnosis not present

## 2022-06-20 DIAGNOSIS — N1330 Unspecified hydronephrosis: Secondary | ICD-10-CM | POA: Diagnosis not present

## 2022-06-20 DIAGNOSIS — E875 Hyperkalemia: Secondary | ICD-10-CM | POA: Diagnosis not present

## 2022-06-20 DIAGNOSIS — C9111 Chronic lymphocytic leukemia of B-cell type in remission: Secondary | ICD-10-CM | POA: Diagnosis not present

## 2022-06-20 DIAGNOSIS — L270 Generalized skin eruption due to drugs and medicaments taken internally: Secondary | ICD-10-CM | POA: Diagnosis not present

## 2022-06-20 DIAGNOSIS — N179 Acute kidney failure, unspecified: Secondary | ICD-10-CM | POA: Diagnosis not present

## 2022-06-21 DIAGNOSIS — D649 Anemia, unspecified: Secondary | ICD-10-CM | POA: Diagnosis not present

## 2022-06-21 DIAGNOSIS — D696 Thrombocytopenia, unspecified: Secondary | ICD-10-CM | POA: Diagnosis not present

## 2022-06-21 DIAGNOSIS — D761 Hemophagocytic lymphohistiocytosis: Secondary | ICD-10-CM | POA: Diagnosis not present

## 2022-06-21 DIAGNOSIS — N179 Acute kidney failure, unspecified: Secondary | ICD-10-CM | POA: Diagnosis not present

## 2022-06-21 DIAGNOSIS — C9110 Chronic lymphocytic leukemia of B-cell type not having achieved remission: Secondary | ICD-10-CM | POA: Diagnosis not present

## 2022-06-21 DIAGNOSIS — N170 Acute kidney failure with tubular necrosis: Secondary | ICD-10-CM | POA: Diagnosis not present

## 2022-06-21 DIAGNOSIS — E875 Hyperkalemia: Secondary | ICD-10-CM | POA: Diagnosis not present

## 2022-06-21 DIAGNOSIS — K683 Retroperitoneal hematoma: Secondary | ICD-10-CM | POA: Diagnosis not present

## 2022-06-21 DIAGNOSIS — K7200 Acute and subacute hepatic failure without coma: Secondary | ICD-10-CM | POA: Diagnosis not present

## 2022-06-21 DIAGNOSIS — C801 Malignant (primary) neoplasm, unspecified: Secondary | ICD-10-CM | POA: Diagnosis not present

## 2022-06-21 DIAGNOSIS — N1330 Unspecified hydronephrosis: Secondary | ICD-10-CM | POA: Diagnosis not present

## 2022-06-21 DIAGNOSIS — L270 Generalized skin eruption due to drugs and medicaments taken internally: Secondary | ICD-10-CM | POA: Diagnosis not present

## 2022-06-22 DIAGNOSIS — J90 Pleural effusion, not elsewhere classified: Secondary | ICD-10-CM | POA: Diagnosis not present

## 2022-06-22 DIAGNOSIS — J9691 Respiratory failure, unspecified with hypoxia: Secondary | ICD-10-CM | POA: Diagnosis not present

## 2022-06-22 DIAGNOSIS — N170 Acute kidney failure with tubular necrosis: Secondary | ICD-10-CM | POA: Diagnosis not present

## 2022-06-22 DIAGNOSIS — C801 Malignant (primary) neoplasm, unspecified: Secondary | ICD-10-CM | POA: Diagnosis not present

## 2022-06-22 DIAGNOSIS — K7200 Acute and subacute hepatic failure without coma: Secondary | ICD-10-CM | POA: Diagnosis not present

## 2022-06-22 DIAGNOSIS — R21 Rash and other nonspecific skin eruption: Secondary | ICD-10-CM | POA: Diagnosis not present

## 2022-06-22 DIAGNOSIS — D696 Thrombocytopenia, unspecified: Secondary | ICD-10-CM | POA: Diagnosis not present

## 2022-06-22 DIAGNOSIS — N1330 Unspecified hydronephrosis: Secondary | ICD-10-CM | POA: Diagnosis not present

## 2022-06-22 DIAGNOSIS — J9811 Atelectasis: Secondary | ICD-10-CM | POA: Diagnosis not present

## 2022-06-22 DIAGNOSIS — Z452 Encounter for adjustment and management of vascular access device: Secondary | ICD-10-CM | POA: Diagnosis not present

## 2022-06-22 DIAGNOSIS — K683 Retroperitoneal hematoma: Secondary | ICD-10-CM | POA: Diagnosis not present

## 2022-06-22 DIAGNOSIS — C9100 Acute lymphoblastic leukemia not having achieved remission: Secondary | ICD-10-CM | POA: Diagnosis not present

## 2022-06-22 DIAGNOSIS — D0422 Carcinoma in situ of skin of left ear and external auricular canal: Secondary | ICD-10-CM | POA: Diagnosis not present

## 2022-06-22 DIAGNOSIS — D649 Anemia, unspecified: Secondary | ICD-10-CM | POA: Diagnosis not present

## 2022-06-22 DIAGNOSIS — D0439 Carcinoma in situ of skin of other parts of face: Secondary | ICD-10-CM | POA: Diagnosis not present

## 2022-06-22 DIAGNOSIS — N179 Acute kidney failure, unspecified: Secondary | ICD-10-CM | POA: Diagnosis not present

## 2022-06-22 DIAGNOSIS — D699 Hemorrhagic condition, unspecified: Secondary | ICD-10-CM | POA: Diagnosis not present

## 2022-06-23 DIAGNOSIS — N170 Acute kidney failure with tubular necrosis: Secondary | ICD-10-CM | POA: Diagnosis not present

## 2022-06-23 DIAGNOSIS — D649 Anemia, unspecified: Secondary | ICD-10-CM | POA: Diagnosis not present

## 2022-06-23 DIAGNOSIS — N1330 Unspecified hydronephrosis: Secondary | ICD-10-CM | POA: Diagnosis not present

## 2022-06-23 DIAGNOSIS — N179 Acute kidney failure, unspecified: Secondary | ICD-10-CM | POA: Diagnosis not present

## 2022-06-23 DIAGNOSIS — D696 Thrombocytopenia, unspecified: Secondary | ICD-10-CM | POA: Diagnosis not present

## 2022-06-23 DIAGNOSIS — K7200 Acute and subacute hepatic failure without coma: Secondary | ICD-10-CM | POA: Diagnosis not present

## 2022-06-23 DIAGNOSIS — K683 Retroperitoneal hematoma: Secondary | ICD-10-CM | POA: Diagnosis not present

## 2022-06-23 DIAGNOSIS — C9110 Chronic lymphocytic leukemia of B-cell type not having achieved remission: Secondary | ICD-10-CM | POA: Diagnosis not present

## 2022-06-23 DIAGNOSIS — J9691 Respiratory failure, unspecified with hypoxia: Secondary | ICD-10-CM | POA: Diagnosis not present

## 2022-06-24 DIAGNOSIS — D62 Acute posthemorrhagic anemia: Secondary | ICD-10-CM | POA: Diagnosis not present

## 2022-06-24 DIAGNOSIS — N1330 Unspecified hydronephrosis: Secondary | ICD-10-CM | POA: Diagnosis not present

## 2022-06-24 DIAGNOSIS — K7200 Acute and subacute hepatic failure without coma: Secondary | ICD-10-CM | POA: Diagnosis not present

## 2022-06-24 DIAGNOSIS — D696 Thrombocytopenia, unspecified: Secondary | ICD-10-CM | POA: Diagnosis not present

## 2022-06-24 DIAGNOSIS — C9110 Chronic lymphocytic leukemia of B-cell type not having achieved remission: Secondary | ICD-10-CM | POA: Diagnosis not present

## 2022-06-24 DIAGNOSIS — N170 Acute kidney failure with tubular necrosis: Secondary | ICD-10-CM | POA: Diagnosis not present

## 2022-06-24 DIAGNOSIS — K683 Retroperitoneal hematoma: Secondary | ICD-10-CM | POA: Diagnosis not present

## 2022-06-24 DIAGNOSIS — N179 Acute kidney failure, unspecified: Secondary | ICD-10-CM | POA: Diagnosis not present

## 2022-06-25 DIAGNOSIS — J9601 Acute respiratory failure with hypoxia: Secondary | ICD-10-CM | POA: Diagnosis not present

## 2022-06-25 DIAGNOSIS — N1330 Unspecified hydronephrosis: Secondary | ICD-10-CM | POA: Diagnosis not present

## 2022-06-25 DIAGNOSIS — C9110 Chronic lymphocytic leukemia of B-cell type not having achieved remission: Secondary | ICD-10-CM | POA: Diagnosis not present

## 2022-06-25 DIAGNOSIS — E871 Hypo-osmolality and hyponatremia: Secondary | ICD-10-CM | POA: Diagnosis not present

## 2022-06-25 DIAGNOSIS — K683 Retroperitoneal hematoma: Secondary | ICD-10-CM | POA: Diagnosis not present

## 2022-06-25 DIAGNOSIS — D62 Acute posthemorrhagic anemia: Secondary | ICD-10-CM | POA: Diagnosis not present

## 2022-06-25 DIAGNOSIS — K7200 Acute and subacute hepatic failure without coma: Secondary | ICD-10-CM | POA: Diagnosis not present

## 2022-06-25 DIAGNOSIS — N170 Acute kidney failure with tubular necrosis: Secondary | ICD-10-CM | POA: Diagnosis not present

## 2022-06-25 DIAGNOSIS — N179 Acute kidney failure, unspecified: Secondary | ICD-10-CM | POA: Diagnosis not present

## 2022-06-26 DIAGNOSIS — N170 Acute kidney failure with tubular necrosis: Secondary | ICD-10-CM | POA: Diagnosis not present

## 2022-06-26 DIAGNOSIS — C9110 Chronic lymphocytic leukemia of B-cell type not having achieved remission: Secondary | ICD-10-CM | POA: Diagnosis not present

## 2022-06-26 DIAGNOSIS — E871 Hypo-osmolality and hyponatremia: Secondary | ICD-10-CM | POA: Diagnosis not present

## 2022-06-26 DIAGNOSIS — K7200 Acute and subacute hepatic failure without coma: Secondary | ICD-10-CM | POA: Diagnosis not present

## 2022-06-26 DIAGNOSIS — N179 Acute kidney failure, unspecified: Secondary | ICD-10-CM | POA: Diagnosis not present

## 2022-06-26 DIAGNOSIS — J9601 Acute respiratory failure with hypoxia: Secondary | ICD-10-CM | POA: Diagnosis not present

## 2022-06-26 DIAGNOSIS — D62 Acute posthemorrhagic anemia: Secondary | ICD-10-CM | POA: Diagnosis not present

## 2022-06-26 DIAGNOSIS — K683 Retroperitoneal hematoma: Secondary | ICD-10-CM | POA: Diagnosis not present

## 2022-06-26 DIAGNOSIS — N1330 Unspecified hydronephrosis: Secondary | ICD-10-CM | POA: Diagnosis not present

## 2022-06-27 DIAGNOSIS — N179 Acute kidney failure, unspecified: Secondary | ICD-10-CM | POA: Diagnosis not present

## 2022-06-27 DIAGNOSIS — K683 Retroperitoneal hematoma: Secondary | ICD-10-CM | POA: Diagnosis not present

## 2022-06-27 DIAGNOSIS — J9601 Acute respiratory failure with hypoxia: Secondary | ICD-10-CM | POA: Diagnosis not present

## 2022-06-27 DIAGNOSIS — N170 Acute kidney failure with tubular necrosis: Secondary | ICD-10-CM | POA: Diagnosis not present

## 2022-06-27 DIAGNOSIS — N1330 Unspecified hydronephrosis: Secondary | ICD-10-CM | POA: Diagnosis not present

## 2022-06-27 DIAGNOSIS — E871 Hypo-osmolality and hyponatremia: Secondary | ICD-10-CM | POA: Diagnosis not present

## 2022-06-28 DIAGNOSIS — K7200 Acute and subacute hepatic failure without coma: Secondary | ICD-10-CM | POA: Diagnosis not present

## 2022-06-28 DIAGNOSIS — J9601 Acute respiratory failure with hypoxia: Secondary | ICD-10-CM | POA: Diagnosis not present

## 2022-06-28 DIAGNOSIS — N179 Acute kidney failure, unspecified: Secondary | ICD-10-CM | POA: Diagnosis not present

## 2022-06-28 DIAGNOSIS — E871 Hypo-osmolality and hyponatremia: Secondary | ICD-10-CM | POA: Diagnosis not present

## 2022-06-28 DIAGNOSIS — N170 Acute kidney failure with tubular necrosis: Secondary | ICD-10-CM | POA: Diagnosis not present

## 2022-06-28 DIAGNOSIS — K683 Retroperitoneal hematoma: Secondary | ICD-10-CM | POA: Diagnosis not present

## 2022-06-28 DIAGNOSIS — C9110 Chronic lymphocytic leukemia of B-cell type not having achieved remission: Secondary | ICD-10-CM | POA: Diagnosis not present

## 2022-06-28 DIAGNOSIS — D62 Acute posthemorrhagic anemia: Secondary | ICD-10-CM | POA: Diagnosis not present

## 2022-06-29 DIAGNOSIS — R109 Unspecified abdominal pain: Secondary | ICD-10-CM | POA: Diagnosis not present

## 2022-06-29 DIAGNOSIS — Z452 Encounter for adjustment and management of vascular access device: Secondary | ICD-10-CM | POA: Diagnosis not present

## 2022-06-29 DIAGNOSIS — Z9981 Dependence on supplemental oxygen: Secondary | ICD-10-CM | POA: Diagnosis not present

## 2022-06-29 DIAGNOSIS — N170 Acute kidney failure with tubular necrosis: Secondary | ICD-10-CM | POA: Diagnosis not present

## 2022-06-29 DIAGNOSIS — J9811 Atelectasis: Secondary | ICD-10-CM | POA: Diagnosis not present

## 2022-06-29 DIAGNOSIS — J9691 Respiratory failure, unspecified with hypoxia: Secondary | ICD-10-CM | POA: Diagnosis not present

## 2022-06-29 DIAGNOSIS — D761 Hemophagocytic lymphohistiocytosis: Secondary | ICD-10-CM | POA: Diagnosis not present

## 2022-06-29 DIAGNOSIS — J90 Pleural effusion, not elsewhere classified: Secondary | ICD-10-CM | POA: Diagnosis not present

## 2022-06-29 DIAGNOSIS — E875 Hyperkalemia: Secondary | ICD-10-CM | POA: Diagnosis not present

## 2022-06-29 DIAGNOSIS — D689 Coagulation defect, unspecified: Secondary | ICD-10-CM | POA: Diagnosis not present

## 2022-06-29 DIAGNOSIS — C9110 Chronic lymphocytic leukemia of B-cell type not having achieved remission: Secondary | ICD-10-CM | POA: Diagnosis not present

## 2022-06-29 DIAGNOSIS — R509 Fever, unspecified: Secondary | ICD-10-CM | POA: Diagnosis not present

## 2022-06-29 DIAGNOSIS — K683 Retroperitoneal hematoma: Secondary | ICD-10-CM | POA: Diagnosis not present

## 2022-06-30 DIAGNOSIS — R21 Rash and other nonspecific skin eruption: Secondary | ICD-10-CM | POA: Diagnosis not present

## 2022-06-30 DIAGNOSIS — N189 Chronic kidney disease, unspecified: Secondary | ICD-10-CM | POA: Diagnosis not present

## 2022-06-30 DIAGNOSIS — K7200 Acute and subacute hepatic failure without coma: Secondary | ICD-10-CM | POA: Diagnosis not present

## 2022-06-30 DIAGNOSIS — R7401 Elevation of levels of liver transaminase levels: Secondary | ICD-10-CM | POA: Diagnosis not present

## 2022-06-30 DIAGNOSIS — J9691 Respiratory failure, unspecified with hypoxia: Secondary | ICD-10-CM | POA: Diagnosis not present

## 2022-06-30 DIAGNOSIS — D761 Hemophagocytic lymphohistiocytosis: Secondary | ICD-10-CM | POA: Diagnosis not present

## 2022-06-30 DIAGNOSIS — N170 Acute kidney failure with tubular necrosis: Secondary | ICD-10-CM | POA: Diagnosis not present

## 2022-06-30 DIAGNOSIS — K683 Retroperitoneal hematoma: Secondary | ICD-10-CM | POA: Diagnosis not present

## 2022-06-30 DIAGNOSIS — N179 Acute kidney failure, unspecified: Secondary | ICD-10-CM | POA: Diagnosis not present

## 2022-06-30 DIAGNOSIS — C9110 Chronic lymphocytic leukemia of B-cell type not having achieved remission: Secondary | ICD-10-CM | POA: Diagnosis not present

## 2022-07-01 DIAGNOSIS — N170 Acute kidney failure with tubular necrosis: Secondary | ICD-10-CM | POA: Diagnosis not present

## 2022-07-01 DIAGNOSIS — K7200 Acute and subacute hepatic failure without coma: Secondary | ICD-10-CM | POA: Diagnosis not present

## 2022-07-01 DIAGNOSIS — R21 Rash and other nonspecific skin eruption: Secondary | ICD-10-CM | POA: Diagnosis not present

## 2022-07-01 DIAGNOSIS — R0902 Hypoxemia: Secondary | ICD-10-CM | POA: Diagnosis not present

## 2022-07-01 DIAGNOSIS — D61818 Other pancytopenia: Secondary | ICD-10-CM | POA: Diagnosis not present

## 2022-07-01 DIAGNOSIS — K683 Retroperitoneal hematoma: Secondary | ICD-10-CM | POA: Diagnosis not present

## 2022-07-01 DIAGNOSIS — J9601 Acute respiratory failure with hypoxia: Secondary | ICD-10-CM | POA: Diagnosis not present

## 2022-07-01 DIAGNOSIS — N179 Acute kidney failure, unspecified: Secondary | ICD-10-CM | POA: Diagnosis not present

## 2022-07-02 DIAGNOSIS — C9111 Chronic lymphocytic leukemia of B-cell type in remission: Secondary | ICD-10-CM | POA: Diagnosis not present

## 2022-07-02 DIAGNOSIS — N179 Acute kidney failure, unspecified: Secondary | ICD-10-CM | POA: Diagnosis not present

## 2022-07-02 DIAGNOSIS — K683 Retroperitoneal hematoma: Secondary | ICD-10-CM | POA: Diagnosis not present

## 2022-07-02 DIAGNOSIS — K7200 Acute and subacute hepatic failure without coma: Secondary | ICD-10-CM | POA: Diagnosis not present

## 2022-07-02 DIAGNOSIS — R509 Fever, unspecified: Secondary | ICD-10-CM | POA: Diagnosis not present

## 2022-07-02 DIAGNOSIS — J9691 Respiratory failure, unspecified with hypoxia: Secondary | ICD-10-CM | POA: Diagnosis not present

## 2022-07-03 DIAGNOSIS — K7200 Acute and subacute hepatic failure without coma: Secondary | ICD-10-CM | POA: Diagnosis not present

## 2022-07-03 DIAGNOSIS — C9110 Chronic lymphocytic leukemia of B-cell type not having achieved remission: Secondary | ICD-10-CM | POA: Diagnosis not present

## 2022-07-03 DIAGNOSIS — R5081 Fever presenting with conditions classified elsewhere: Secondary | ICD-10-CM | POA: Diagnosis not present

## 2022-07-03 DIAGNOSIS — K683 Retroperitoneal hematoma: Secondary | ICD-10-CM | POA: Diagnosis not present

## 2022-07-03 DIAGNOSIS — D72829 Elevated white blood cell count, unspecified: Secondary | ICD-10-CM | POA: Diagnosis not present

## 2022-07-03 DIAGNOSIS — D761 Hemophagocytic lymphohistiocytosis: Secondary | ICD-10-CM | POA: Diagnosis not present

## 2022-07-03 DIAGNOSIS — N170 Acute kidney failure with tubular necrosis: Secondary | ICD-10-CM | POA: Diagnosis not present

## 2022-07-03 DIAGNOSIS — R109 Unspecified abdominal pain: Secondary | ICD-10-CM | POA: Diagnosis not present

## 2022-07-03 DIAGNOSIS — C4491 Basal cell carcinoma of skin, unspecified: Secondary | ICD-10-CM | POA: Diagnosis not present

## 2022-07-03 DIAGNOSIS — J9691 Respiratory failure, unspecified with hypoxia: Secondary | ICD-10-CM | POA: Diagnosis not present

## 2022-07-04 DIAGNOSIS — R509 Fever, unspecified: Secondary | ICD-10-CM | POA: Diagnosis not present

## 2022-07-04 DIAGNOSIS — D72829 Elevated white blood cell count, unspecified: Secondary | ICD-10-CM | POA: Diagnosis not present

## 2022-07-04 DIAGNOSIS — J9691 Respiratory failure, unspecified with hypoxia: Secondary | ICD-10-CM | POA: Diagnosis not present

## 2022-07-04 DIAGNOSIS — N170 Acute kidney failure with tubular necrosis: Secondary | ICD-10-CM | POA: Diagnosis not present

## 2022-07-04 DIAGNOSIS — Z9981 Dependence on supplemental oxygen: Secondary | ICD-10-CM | POA: Diagnosis not present

## 2022-07-04 DIAGNOSIS — K5900 Constipation, unspecified: Secondary | ICD-10-CM | POA: Diagnosis not present

## 2022-07-04 DIAGNOSIS — C9110 Chronic lymphocytic leukemia of B-cell type not having achieved remission: Secondary | ICD-10-CM | POA: Diagnosis not present

## 2022-07-04 DIAGNOSIS — R7401 Elevation of levels of liver transaminase levels: Secondary | ICD-10-CM | POA: Diagnosis not present

## 2022-07-04 DIAGNOSIS — K683 Retroperitoneal hematoma: Secondary | ICD-10-CM | POA: Diagnosis not present

## 2022-07-04 DIAGNOSIS — N186 End stage renal disease: Secondary | ICD-10-CM | POA: Diagnosis not present

## 2022-07-04 DIAGNOSIS — K7200 Acute and subacute hepatic failure without coma: Secondary | ICD-10-CM | POA: Diagnosis not present

## 2022-07-05 DIAGNOSIS — K7200 Acute and subacute hepatic failure without coma: Secondary | ICD-10-CM | POA: Diagnosis not present

## 2022-07-05 DIAGNOSIS — C9110 Chronic lymphocytic leukemia of B-cell type not having achieved remission: Secondary | ICD-10-CM | POA: Diagnosis not present

## 2022-07-05 DIAGNOSIS — K683 Retroperitoneal hematoma: Secondary | ICD-10-CM | POA: Diagnosis not present

## 2022-07-05 DIAGNOSIS — R7401 Elevation of levels of liver transaminase levels: Secondary | ICD-10-CM | POA: Diagnosis not present

## 2022-07-05 DIAGNOSIS — D761 Hemophagocytic lymphohistiocytosis: Secondary | ICD-10-CM | POA: Diagnosis not present

## 2022-07-06 DIAGNOSIS — C9110 Chronic lymphocytic leukemia of B-cell type not having achieved remission: Secondary | ICD-10-CM | POA: Diagnosis not present

## 2022-07-06 DIAGNOSIS — D761 Hemophagocytic lymphohistiocytosis: Secondary | ICD-10-CM | POA: Diagnosis not present

## 2022-07-06 DIAGNOSIS — K683 Retroperitoneal hematoma: Secondary | ICD-10-CM | POA: Diagnosis not present

## 2022-07-06 DIAGNOSIS — R7401 Elevation of levels of liver transaminase levels: Secondary | ICD-10-CM | POA: Diagnosis not present

## 2022-07-07 DIAGNOSIS — D761 Hemophagocytic lymphohistiocytosis: Secondary | ICD-10-CM | POA: Diagnosis not present

## 2022-07-07 DIAGNOSIS — R7401 Elevation of levels of liver transaminase levels: Secondary | ICD-10-CM | POA: Diagnosis not present

## 2022-07-07 DIAGNOSIS — C9110 Chronic lymphocytic leukemia of B-cell type not having achieved remission: Secondary | ICD-10-CM | POA: Diagnosis not present

## 2022-07-07 DIAGNOSIS — K683 Retroperitoneal hematoma: Secondary | ICD-10-CM | POA: Diagnosis not present

## 2022-07-09 DIAGNOSIS — C9110 Chronic lymphocytic leukemia of B-cell type not having achieved remission: Secondary | ICD-10-CM | POA: Diagnosis not present

## 2022-07-09 DIAGNOSIS — R7401 Elevation of levels of liver transaminase levels: Secondary | ICD-10-CM | POA: Diagnosis not present

## 2022-07-09 DIAGNOSIS — D761 Hemophagocytic lymphohistiocytosis: Secondary | ICD-10-CM | POA: Diagnosis not present

## 2022-07-09 DIAGNOSIS — K683 Retroperitoneal hematoma: Secondary | ICD-10-CM | POA: Diagnosis not present

## 2022-07-10 DIAGNOSIS — R601 Generalized edema: Secondary | ICD-10-CM | POA: Diagnosis not present

## 2022-07-10 DIAGNOSIS — R578 Other shock: Secondary | ICD-10-CM | POA: Diagnosis not present

## 2022-07-10 DIAGNOSIS — Z85828 Personal history of other malignant neoplasm of skin: Secondary | ICD-10-CM | POA: Diagnosis not present

## 2022-07-10 DIAGNOSIS — Z9911 Dependence on respirator [ventilator] status: Secondary | ICD-10-CM | POA: Diagnosis not present

## 2022-07-10 DIAGNOSIS — K2960 Other gastritis without bleeding: Secondary | ICD-10-CM | POA: Diagnosis not present

## 2022-07-10 DIAGNOSIS — J90 Pleural effusion, not elsewhere classified: Secondary | ICD-10-CM | POA: Diagnosis not present

## 2022-07-10 DIAGNOSIS — M3581 Multisystem inflammatory syndrome: Secondary | ICD-10-CM | POA: Diagnosis not present

## 2022-07-10 DIAGNOSIS — K668 Other specified disorders of peritoneum: Secondary | ICD-10-CM | POA: Diagnosis not present

## 2022-07-10 DIAGNOSIS — R140 Abdominal distension (gaseous): Secondary | ICD-10-CM | POA: Diagnosis not present

## 2022-07-10 DIAGNOSIS — R079 Chest pain, unspecified: Secondary | ICD-10-CM | POA: Diagnosis not present

## 2022-07-10 DIAGNOSIS — J9811 Atelectasis: Secondary | ICD-10-CM | POA: Diagnosis not present

## 2022-07-10 DIAGNOSIS — R579 Shock, unspecified: Secondary | ICD-10-CM | POA: Diagnosis not present

## 2022-07-10 DIAGNOSIS — C9110 Chronic lymphocytic leukemia of B-cell type not having achieved remission: Secondary | ICD-10-CM | POA: Diagnosis not present

## 2022-07-10 DIAGNOSIS — R0609 Other forms of dyspnea: Secondary | ICD-10-CM | POA: Diagnosis not present

## 2022-07-10 DIAGNOSIS — K631 Perforation of intestine (nontraumatic): Secondary | ICD-10-CM | POA: Diagnosis not present

## 2022-07-10 DIAGNOSIS — K265 Chronic or unspecified duodenal ulcer with perforation: Secondary | ICD-10-CM | POA: Diagnosis not present

## 2022-07-10 DIAGNOSIS — Z4682 Encounter for fitting and adjustment of non-vascular catheter: Secondary | ICD-10-CM | POA: Diagnosis not present

## 2022-07-10 DIAGNOSIS — R530 Neoplastic (malignant) related fatigue: Secondary | ICD-10-CM | POA: Diagnosis not present

## 2022-07-10 DIAGNOSIS — Z452 Encounter for adjustment and management of vascular access device: Secondary | ICD-10-CM | POA: Diagnosis not present

## 2022-07-10 DIAGNOSIS — I959 Hypotension, unspecified: Secondary | ICD-10-CM | POA: Diagnosis not present

## 2022-07-10 DIAGNOSIS — J811 Chronic pulmonary edema: Secondary | ICD-10-CM | POA: Diagnosis not present

## 2022-07-10 DIAGNOSIS — K683 Retroperitoneal hematoma: Secondary | ICD-10-CM | POA: Diagnosis not present

## 2022-07-10 DIAGNOSIS — C801 Malignant (primary) neoplasm, unspecified: Secondary | ICD-10-CM | POA: Diagnosis not present

## 2022-07-10 DIAGNOSIS — N170 Acute kidney failure with tubular necrosis: Secondary | ICD-10-CM | POA: Diagnosis not present

## 2022-07-11 DIAGNOSIS — R7401 Elevation of levels of liver transaminase levels: Secondary | ICD-10-CM | POA: Diagnosis not present

## 2022-07-11 DIAGNOSIS — C9110 Chronic lymphocytic leukemia of B-cell type not having achieved remission: Secondary | ICD-10-CM | POA: Diagnosis not present

## 2022-07-11 DIAGNOSIS — K631 Perforation of intestine (nontraumatic): Secondary | ICD-10-CM | POA: Diagnosis not present

## 2022-07-11 DIAGNOSIS — R579 Shock, unspecified: Secondary | ICD-10-CM | POA: Diagnosis not present

## 2022-07-11 DIAGNOSIS — N170 Acute kidney failure with tubular necrosis: Secondary | ICD-10-CM | POA: Diagnosis not present

## 2022-07-11 DIAGNOSIS — K683 Retroperitoneal hematoma: Secondary | ICD-10-CM | POA: Diagnosis not present

## 2022-07-11 DIAGNOSIS — D761 Hemophagocytic lymphohistiocytosis: Secondary | ICD-10-CM | POA: Diagnosis not present

## 2022-07-12 DIAGNOSIS — K683 Retroperitoneal hematoma: Secondary | ICD-10-CM | POA: Diagnosis not present

## 2022-07-12 DIAGNOSIS — R578 Other shock: Secondary | ICD-10-CM | POA: Diagnosis not present

## 2022-07-12 DIAGNOSIS — K7200 Acute and subacute hepatic failure without coma: Secondary | ICD-10-CM | POA: Diagnosis not present

## 2022-07-12 DIAGNOSIS — K631 Perforation of intestine (nontraumatic): Secondary | ICD-10-CM | POA: Diagnosis not present

## 2022-07-13 DIAGNOSIS — K7200 Acute and subacute hepatic failure without coma: Secondary | ICD-10-CM | POA: Diagnosis not present

## 2022-07-13 DIAGNOSIS — K631 Perforation of intestine (nontraumatic): Secondary | ICD-10-CM | POA: Diagnosis not present

## 2022-07-13 DIAGNOSIS — K683 Retroperitoneal hematoma: Secondary | ICD-10-CM | POA: Diagnosis not present

## 2022-07-13 DIAGNOSIS — R578 Other shock: Secondary | ICD-10-CM | POA: Diagnosis not present

## 2022-07-14 DIAGNOSIS — C9110 Chronic lymphocytic leukemia of B-cell type not having achieved remission: Secondary | ICD-10-CM | POA: Diagnosis not present

## 2022-07-14 DIAGNOSIS — K683 Retroperitoneal hematoma: Secondary | ICD-10-CM | POA: Diagnosis not present

## 2022-07-14 DIAGNOSIS — N170 Acute kidney failure with tubular necrosis: Secondary | ICD-10-CM | POA: Diagnosis not present

## 2022-07-14 DIAGNOSIS — C801 Malignant (primary) neoplasm, unspecified: Secondary | ICD-10-CM | POA: Diagnosis not present

## 2022-07-15 DIAGNOSIS — N170 Acute kidney failure with tubular necrosis: Secondary | ICD-10-CM | POA: Diagnosis not present

## 2022-07-15 DIAGNOSIS — J9811 Atelectasis: Secondary | ICD-10-CM | POA: Diagnosis not present

## 2022-07-15 DIAGNOSIS — C9110 Chronic lymphocytic leukemia of B-cell type not having achieved remission: Secondary | ICD-10-CM | POA: Diagnosis not present

## 2022-07-15 DIAGNOSIS — K683 Retroperitoneal hematoma: Secondary | ICD-10-CM | POA: Diagnosis not present

## 2022-07-15 DIAGNOSIS — J90 Pleural effusion, not elsewhere classified: Secondary | ICD-10-CM | POA: Diagnosis not present

## 2022-07-15 DIAGNOSIS — Z4682 Encounter for fitting and adjustment of non-vascular catheter: Secondary | ICD-10-CM | POA: Diagnosis not present

## 2022-07-15 DIAGNOSIS — D761 Hemophagocytic lymphohistiocytosis: Secondary | ICD-10-CM | POA: Diagnosis not present

## 2022-07-16 DIAGNOSIS — D61818 Other pancytopenia: Secondary | ICD-10-CM | POA: Diagnosis not present

## 2022-07-16 DIAGNOSIS — C9110 Chronic lymphocytic leukemia of B-cell type not having achieved remission: Secondary | ICD-10-CM | POA: Diagnosis not present

## 2022-07-16 DIAGNOSIS — K683 Retroperitoneal hematoma: Secondary | ICD-10-CM | POA: Diagnosis not present

## 2022-07-16 DIAGNOSIS — D62 Acute posthemorrhagic anemia: Secondary | ICD-10-CM | POA: Diagnosis not present

## 2022-07-17 DIAGNOSIS — K7200 Acute and subacute hepatic failure without coma: Secondary | ICD-10-CM | POA: Diagnosis not present

## 2022-07-17 DIAGNOSIS — C9110 Chronic lymphocytic leukemia of B-cell type not having achieved remission: Secondary | ICD-10-CM | POA: Diagnosis not present

## 2022-07-17 DIAGNOSIS — D682 Hereditary deficiency of other clotting factors: Secondary | ICD-10-CM | POA: Diagnosis not present

## 2022-07-17 DIAGNOSIS — K683 Retroperitoneal hematoma: Secondary | ICD-10-CM | POA: Diagnosis not present

## 2022-07-18 DIAGNOSIS — C9110 Chronic lymphocytic leukemia of B-cell type not having achieved remission: Secondary | ICD-10-CM | POA: Diagnosis not present

## 2022-07-18 DIAGNOSIS — K683 Retroperitoneal hematoma: Secondary | ICD-10-CM | POA: Diagnosis not present

## 2022-07-19 DIAGNOSIS — D62 Acute posthemorrhagic anemia: Secondary | ICD-10-CM | POA: Diagnosis not present

## 2022-07-19 DIAGNOSIS — N1330 Unspecified hydronephrosis: Secondary | ICD-10-CM | POA: Diagnosis not present

## 2022-07-19 DIAGNOSIS — N170 Acute kidney failure with tubular necrosis: Secondary | ICD-10-CM | POA: Diagnosis not present

## 2022-07-19 DIAGNOSIS — R609 Edema, unspecified: Secondary | ICD-10-CM | POA: Diagnosis not present

## 2022-07-19 DIAGNOSIS — K683 Retroperitoneal hematoma: Secondary | ICD-10-CM | POA: Diagnosis not present

## 2022-07-19 DIAGNOSIS — K7200 Acute and subacute hepatic failure without coma: Secondary | ICD-10-CM | POA: Diagnosis not present

## 2022-07-19 DIAGNOSIS — C9110 Chronic lymphocytic leukemia of B-cell type not having achieved remission: Secondary | ICD-10-CM | POA: Diagnosis not present

## 2022-07-20 DIAGNOSIS — C801 Malignant (primary) neoplasm, unspecified: Secondary | ICD-10-CM | POA: Diagnosis not present

## 2022-07-20 DIAGNOSIS — I959 Hypotension, unspecified: Secondary | ICD-10-CM | POA: Diagnosis not present

## 2022-07-20 DIAGNOSIS — R918 Other nonspecific abnormal finding of lung field: Secondary | ICD-10-CM | POA: Diagnosis not present

## 2022-07-20 DIAGNOSIS — R6521 Severe sepsis with septic shock: Secondary | ICD-10-CM | POA: Diagnosis not present

## 2022-07-20 DIAGNOSIS — R079 Chest pain, unspecified: Secondary | ICD-10-CM | POA: Diagnosis not present

## 2022-07-20 DIAGNOSIS — A419 Sepsis, unspecified organism: Secondary | ICD-10-CM | POA: Diagnosis not present

## 2022-07-20 DIAGNOSIS — J90 Pleural effusion, not elsewhere classified: Secondary | ICD-10-CM | POA: Diagnosis not present

## 2022-07-20 DIAGNOSIS — N170 Acute kidney failure with tubular necrosis: Secondary | ICD-10-CM | POA: Diagnosis not present

## 2022-07-20 DIAGNOSIS — J9811 Atelectasis: Secondary | ICD-10-CM | POA: Diagnosis not present

## 2022-07-21 DIAGNOSIS — A419 Sepsis, unspecified organism: Secondary | ICD-10-CM | POA: Diagnosis not present

## 2022-07-21 DIAGNOSIS — N170 Acute kidney failure with tubular necrosis: Secondary | ICD-10-CM | POA: Diagnosis not present

## 2022-07-21 DIAGNOSIS — K7200 Acute and subacute hepatic failure without coma: Secondary | ICD-10-CM | POA: Diagnosis not present

## 2022-07-21 DIAGNOSIS — J90 Pleural effusion, not elsewhere classified: Secondary | ICD-10-CM | POA: Diagnosis not present

## 2022-07-21 DIAGNOSIS — K683 Retroperitoneal hematoma: Secondary | ICD-10-CM | POA: Diagnosis not present

## 2022-07-21 DIAGNOSIS — C9110 Chronic lymphocytic leukemia of B-cell type not having achieved remission: Secondary | ICD-10-CM | POA: Diagnosis not present

## 2022-07-21 DIAGNOSIS — I824Z2 Acute embolism and thrombosis of unspecified deep veins of left distal lower extremity: Secondary | ICD-10-CM | POA: Diagnosis not present

## 2022-07-21 DIAGNOSIS — R6521 Severe sepsis with septic shock: Secondary | ICD-10-CM | POA: Diagnosis not present

## 2022-07-22 DIAGNOSIS — Z0189 Encounter for other specified special examinations: Secondary | ICD-10-CM | POA: Diagnosis not present

## 2022-07-22 DIAGNOSIS — K631 Perforation of intestine (nontraumatic): Secondary | ICD-10-CM | POA: Diagnosis not present

## 2022-07-22 DIAGNOSIS — J90 Pleural effusion, not elsewhere classified: Secondary | ICD-10-CM | POA: Diagnosis not present

## 2022-07-22 DIAGNOSIS — J9601 Acute respiratory failure with hypoxia: Secondary | ICD-10-CM | POA: Diagnosis not present

## 2022-07-22 DIAGNOSIS — N179 Acute kidney failure, unspecified: Secondary | ICD-10-CM | POA: Diagnosis not present

## 2022-07-22 DIAGNOSIS — D761 Hemophagocytic lymphohistiocytosis: Secondary | ICD-10-CM | POA: Diagnosis not present

## 2022-07-22 DIAGNOSIS — T451X5A Adverse effect of antineoplastic and immunosuppressive drugs, initial encounter: Secondary | ICD-10-CM | POA: Diagnosis not present

## 2022-07-22 DIAGNOSIS — Z9981 Dependence on supplemental oxygen: Secondary | ICD-10-CM | POA: Diagnosis not present

## 2022-07-22 DIAGNOSIS — K683 Retroperitoneal hematoma: Secondary | ICD-10-CM | POA: Diagnosis not present

## 2022-07-22 DIAGNOSIS — Z79899 Other long term (current) drug therapy: Secondary | ICD-10-CM | POA: Diagnosis not present

## 2022-07-22 DIAGNOSIS — J9602 Acute respiratory failure with hypercapnia: Secondary | ICD-10-CM | POA: Diagnosis not present

## 2022-07-22 DIAGNOSIS — N170 Acute kidney failure with tubular necrosis: Secondary | ICD-10-CM | POA: Diagnosis not present

## 2022-07-22 DIAGNOSIS — D84821 Immunodeficiency due to drugs: Secondary | ICD-10-CM | POA: Diagnosis not present

## 2022-07-22 DIAGNOSIS — C9110 Chronic lymphocytic leukemia of B-cell type not having achieved remission: Secondary | ICD-10-CM | POA: Diagnosis not present

## 2022-07-23 DIAGNOSIS — D761 Hemophagocytic lymphohistiocytosis: Secondary | ICD-10-CM | POA: Diagnosis not present

## 2022-07-23 DIAGNOSIS — J9601 Acute respiratory failure with hypoxia: Secondary | ICD-10-CM | POA: Diagnosis not present

## 2022-07-23 DIAGNOSIS — J9602 Acute respiratory failure with hypercapnia: Secondary | ICD-10-CM | POA: Diagnosis not present

## 2022-07-23 DIAGNOSIS — K683 Retroperitoneal hematoma: Secondary | ICD-10-CM | POA: Diagnosis not present

## 2022-07-23 DIAGNOSIS — N170 Acute kidney failure with tubular necrosis: Secondary | ICD-10-CM | POA: Diagnosis not present

## 2022-07-23 DIAGNOSIS — A419 Sepsis, unspecified organism: Secondary | ICD-10-CM | POA: Diagnosis not present

## 2022-07-23 DIAGNOSIS — R6521 Severe sepsis with septic shock: Secondary | ICD-10-CM | POA: Diagnosis not present

## 2022-07-23 DIAGNOSIS — C9110 Chronic lymphocytic leukemia of B-cell type not having achieved remission: Secondary | ICD-10-CM | POA: Diagnosis not present

## 2022-07-23 DIAGNOSIS — Z79899 Other long term (current) drug therapy: Secondary | ICD-10-CM | POA: Diagnosis not present

## 2022-07-23 DIAGNOSIS — K631 Perforation of intestine (nontraumatic): Secondary | ICD-10-CM | POA: Diagnosis not present

## 2022-07-23 DIAGNOSIS — T451X5A Adverse effect of antineoplastic and immunosuppressive drugs, initial encounter: Secondary | ICD-10-CM | POA: Diagnosis not present

## 2022-07-23 DIAGNOSIS — N179 Acute kidney failure, unspecified: Secondary | ICD-10-CM | POA: Diagnosis not present

## 2022-07-23 DIAGNOSIS — D84821 Immunodeficiency due to drugs: Secondary | ICD-10-CM | POA: Diagnosis not present

## 2022-07-24 DIAGNOSIS — R6521 Severe sepsis with septic shock: Secondary | ICD-10-CM | POA: Diagnosis not present

## 2022-07-24 DIAGNOSIS — T451X5A Adverse effect of antineoplastic and immunosuppressive drugs, initial encounter: Secondary | ICD-10-CM | POA: Diagnosis not present

## 2022-07-24 DIAGNOSIS — D84821 Immunodeficiency due to drugs: Secondary | ICD-10-CM | POA: Diagnosis not present

## 2022-07-24 DIAGNOSIS — D761 Hemophagocytic lymphohistiocytosis: Secondary | ICD-10-CM | POA: Diagnosis not present

## 2022-07-24 DIAGNOSIS — K631 Perforation of intestine (nontraumatic): Secondary | ICD-10-CM | POA: Diagnosis not present

## 2022-07-24 DIAGNOSIS — K683 Retroperitoneal hematoma: Secondary | ICD-10-CM | POA: Diagnosis not present

## 2022-07-24 DIAGNOSIS — Z79899 Other long term (current) drug therapy: Secondary | ICD-10-CM | POA: Diagnosis not present

## 2022-07-24 DIAGNOSIS — J90 Pleural effusion, not elsewhere classified: Secondary | ICD-10-CM | POA: Diagnosis not present

## 2022-07-24 DIAGNOSIS — J9602 Acute respiratory failure with hypercapnia: Secondary | ICD-10-CM | POA: Diagnosis not present

## 2022-07-24 DIAGNOSIS — J9811 Atelectasis: Secondary | ICD-10-CM | POA: Diagnosis not present

## 2022-07-24 DIAGNOSIS — N170 Acute kidney failure with tubular necrosis: Secondary | ICD-10-CM | POA: Diagnosis not present

## 2022-07-24 DIAGNOSIS — C9110 Chronic lymphocytic leukemia of B-cell type not having achieved remission: Secondary | ICD-10-CM | POA: Diagnosis not present

## 2022-07-24 DIAGNOSIS — C801 Malignant (primary) neoplasm, unspecified: Secondary | ICD-10-CM | POA: Diagnosis not present

## 2022-07-24 DIAGNOSIS — A419 Sepsis, unspecified organism: Secondary | ICD-10-CM | POA: Diagnosis not present

## 2022-07-24 DIAGNOSIS — N179 Acute kidney failure, unspecified: Secondary | ICD-10-CM | POA: Diagnosis not present

## 2022-07-24 DIAGNOSIS — J9601 Acute respiratory failure with hypoxia: Secondary | ICD-10-CM | POA: Diagnosis not present

## 2022-07-25 DIAGNOSIS — J9602 Acute respiratory failure with hypercapnia: Secondary | ICD-10-CM | POA: Diagnosis not present

## 2022-07-25 DIAGNOSIS — Z9981 Dependence on supplemental oxygen: Secondary | ICD-10-CM | POA: Diagnosis not present

## 2022-07-25 DIAGNOSIS — Z79899 Other long term (current) drug therapy: Secondary | ICD-10-CM | POA: Diagnosis not present

## 2022-07-25 DIAGNOSIS — J9601 Acute respiratory failure with hypoxia: Secondary | ICD-10-CM | POA: Diagnosis not present

## 2022-07-25 DIAGNOSIS — D761 Hemophagocytic lymphohistiocytosis: Secondary | ICD-10-CM | POA: Diagnosis not present

## 2022-07-25 DIAGNOSIS — C9110 Chronic lymphocytic leukemia of B-cell type not having achieved remission: Secondary | ICD-10-CM | POA: Diagnosis not present

## 2022-07-25 DIAGNOSIS — J90 Pleural effusion, not elsewhere classified: Secondary | ICD-10-CM | POA: Diagnosis not present

## 2022-07-25 DIAGNOSIS — K683 Retroperitoneal hematoma: Secondary | ICD-10-CM | POA: Diagnosis not present

## 2022-07-25 DIAGNOSIS — N179 Acute kidney failure, unspecified: Secondary | ICD-10-CM | POA: Diagnosis not present

## 2022-07-25 DIAGNOSIS — D84821 Immunodeficiency due to drugs: Secondary | ICD-10-CM | POA: Diagnosis not present

## 2022-07-25 DIAGNOSIS — T451X5A Adverse effect of antineoplastic and immunosuppressive drugs, initial encounter: Secondary | ICD-10-CM | POA: Diagnosis not present

## 2022-07-25 DIAGNOSIS — N170 Acute kidney failure with tubular necrosis: Secondary | ICD-10-CM | POA: Diagnosis not present

## 2022-07-25 DIAGNOSIS — K631 Perforation of intestine (nontraumatic): Secondary | ICD-10-CM | POA: Diagnosis not present

## 2022-07-26 DIAGNOSIS — Z79899 Other long term (current) drug therapy: Secondary | ICD-10-CM | POA: Diagnosis not present

## 2022-07-26 DIAGNOSIS — N170 Acute kidney failure with tubular necrosis: Secondary | ICD-10-CM | POA: Diagnosis not present

## 2022-07-26 DIAGNOSIS — Z9981 Dependence on supplemental oxygen: Secondary | ICD-10-CM | POA: Diagnosis not present

## 2022-07-26 DIAGNOSIS — J90 Pleural effusion, not elsewhere classified: Secondary | ICD-10-CM | POA: Diagnosis not present

## 2022-07-26 DIAGNOSIS — J9601 Acute respiratory failure with hypoxia: Secondary | ICD-10-CM | POA: Diagnosis not present

## 2022-07-26 DIAGNOSIS — T451X5A Adverse effect of antineoplastic and immunosuppressive drugs, initial encounter: Secondary | ICD-10-CM | POA: Diagnosis not present

## 2022-07-26 DIAGNOSIS — D761 Hemophagocytic lymphohistiocytosis: Secondary | ICD-10-CM | POA: Diagnosis not present

## 2022-07-26 DIAGNOSIS — K683 Retroperitoneal hematoma: Secondary | ICD-10-CM | POA: Diagnosis not present

## 2022-07-26 DIAGNOSIS — N179 Acute kidney failure, unspecified: Secondary | ICD-10-CM | POA: Diagnosis not present

## 2022-07-26 DIAGNOSIS — D84821 Immunodeficiency due to drugs: Secondary | ICD-10-CM | POA: Diagnosis not present

## 2022-07-26 DIAGNOSIS — C9110 Chronic lymphocytic leukemia of B-cell type not having achieved remission: Secondary | ICD-10-CM | POA: Diagnosis not present

## 2022-07-26 DIAGNOSIS — K631 Perforation of intestine (nontraumatic): Secondary | ICD-10-CM | POA: Diagnosis not present

## 2022-07-26 DIAGNOSIS — J9602 Acute respiratory failure with hypercapnia: Secondary | ICD-10-CM | POA: Diagnosis not present

## 2022-07-27 DIAGNOSIS — T451X5A Adverse effect of antineoplastic and immunosuppressive drugs, initial encounter: Secondary | ICD-10-CM | POA: Diagnosis not present

## 2022-07-27 DIAGNOSIS — J90 Pleural effusion, not elsewhere classified: Secondary | ICD-10-CM | POA: Diagnosis not present

## 2022-07-27 DIAGNOSIS — J9601 Acute respiratory failure with hypoxia: Secondary | ICD-10-CM | POA: Diagnosis not present

## 2022-07-27 DIAGNOSIS — K631 Perforation of intestine (nontraumatic): Secondary | ICD-10-CM | POA: Diagnosis not present

## 2022-07-27 DIAGNOSIS — N1330 Unspecified hydronephrosis: Secondary | ICD-10-CM | POA: Diagnosis not present

## 2022-07-27 DIAGNOSIS — K683 Retroperitoneal hematoma: Secondary | ICD-10-CM | POA: Diagnosis not present

## 2022-07-27 DIAGNOSIS — D84821 Immunodeficiency due to drugs: Secondary | ICD-10-CM | POA: Diagnosis not present

## 2022-07-27 DIAGNOSIS — D761 Hemophagocytic lymphohistiocytosis: Secondary | ICD-10-CM | POA: Diagnosis not present

## 2022-07-27 DIAGNOSIS — N179 Acute kidney failure, unspecified: Secondary | ICD-10-CM | POA: Diagnosis not present

## 2022-07-27 DIAGNOSIS — C9110 Chronic lymphocytic leukemia of B-cell type not having achieved remission: Secondary | ICD-10-CM | POA: Diagnosis not present

## 2022-07-27 DIAGNOSIS — Z79899 Other long term (current) drug therapy: Secondary | ICD-10-CM | POA: Diagnosis not present

## 2022-07-28 DIAGNOSIS — K7200 Acute and subacute hepatic failure without coma: Secondary | ICD-10-CM | POA: Diagnosis not present

## 2022-07-28 DIAGNOSIS — C801 Malignant (primary) neoplasm, unspecified: Secondary | ICD-10-CM | POA: Diagnosis not present

## 2022-07-28 DIAGNOSIS — I82C11 Acute embolism and thrombosis of right internal jugular vein: Secondary | ICD-10-CM | POA: Diagnosis not present

## 2022-07-28 DIAGNOSIS — K683 Retroperitoneal hematoma: Secondary | ICD-10-CM | POA: Diagnosis not present

## 2022-07-28 DIAGNOSIS — N170 Acute kidney failure with tubular necrosis: Secondary | ICD-10-CM | POA: Diagnosis not present

## 2022-07-28 DIAGNOSIS — C9110 Chronic lymphocytic leukemia of B-cell type not having achieved remission: Secondary | ICD-10-CM | POA: Diagnosis not present

## 2022-07-29 DIAGNOSIS — N170 Acute kidney failure with tubular necrosis: Secondary | ICD-10-CM | POA: Diagnosis not present

## 2022-07-29 DIAGNOSIS — C9110 Chronic lymphocytic leukemia of B-cell type not having achieved remission: Secondary | ICD-10-CM | POA: Diagnosis not present

## 2022-07-29 DIAGNOSIS — D761 Hemophagocytic lymphohistiocytosis: Secondary | ICD-10-CM | POA: Diagnosis not present

## 2022-07-29 DIAGNOSIS — K683 Retroperitoneal hematoma: Secondary | ICD-10-CM | POA: Diagnosis not present

## 2022-07-30 DIAGNOSIS — E039 Hypothyroidism, unspecified: Secondary | ICD-10-CM | POA: Diagnosis not present

## 2022-07-30 DIAGNOSIS — K683 Retroperitoneal hematoma: Secondary | ICD-10-CM | POA: Diagnosis not present

## 2022-07-30 DIAGNOSIS — N170 Acute kidney failure with tubular necrosis: Secondary | ICD-10-CM | POA: Diagnosis not present

## 2022-07-30 DIAGNOSIS — C9110 Chronic lymphocytic leukemia of B-cell type not having achieved remission: Secondary | ICD-10-CM | POA: Diagnosis not present

## 2022-07-30 DIAGNOSIS — D761 Hemophagocytic lymphohistiocytosis: Secondary | ICD-10-CM | POA: Diagnosis not present

## 2022-07-31 DIAGNOSIS — N170 Acute kidney failure with tubular necrosis: Secondary | ICD-10-CM | POA: Diagnosis not present

## 2022-07-31 DIAGNOSIS — I824Z2 Acute embolism and thrombosis of unspecified deep veins of left distal lower extremity: Secondary | ICD-10-CM | POA: Diagnosis not present

## 2022-07-31 DIAGNOSIS — I82462 Acute embolism and thrombosis of left calf muscular vein: Secondary | ICD-10-CM | POA: Diagnosis not present

## 2022-07-31 DIAGNOSIS — K683 Retroperitoneal hematoma: Secondary | ICD-10-CM | POA: Diagnosis not present

## 2022-07-31 DIAGNOSIS — C9110 Chronic lymphocytic leukemia of B-cell type not having achieved remission: Secondary | ICD-10-CM | POA: Diagnosis not present

## 2022-07-31 DIAGNOSIS — D761 Hemophagocytic lymphohistiocytosis: Secondary | ICD-10-CM | POA: Diagnosis not present

## 2022-08-01 DIAGNOSIS — K683 Retroperitoneal hematoma: Secondary | ICD-10-CM | POA: Diagnosis not present

## 2022-08-01 DIAGNOSIS — D761 Hemophagocytic lymphohistiocytosis: Secondary | ICD-10-CM | POA: Diagnosis not present

## 2022-08-01 DIAGNOSIS — A0472 Enterocolitis due to Clostridium difficile, not specified as recurrent: Secondary | ICD-10-CM | POA: Diagnosis not present

## 2022-08-01 DIAGNOSIS — C9110 Chronic lymphocytic leukemia of B-cell type not having achieved remission: Secondary | ICD-10-CM | POA: Diagnosis not present

## 2022-08-02 DIAGNOSIS — C9110 Chronic lymphocytic leukemia of B-cell type not having achieved remission: Secondary | ICD-10-CM | POA: Diagnosis not present

## 2022-08-02 DIAGNOSIS — D761 Hemophagocytic lymphohistiocytosis: Secondary | ICD-10-CM | POA: Diagnosis not present

## 2022-08-02 DIAGNOSIS — K683 Retroperitoneal hematoma: Secondary | ICD-10-CM | POA: Diagnosis not present

## 2022-08-02 DIAGNOSIS — A0472 Enterocolitis due to Clostridium difficile, not specified as recurrent: Secondary | ICD-10-CM | POA: Diagnosis not present

## 2022-08-03 DIAGNOSIS — K683 Retroperitoneal hematoma: Secondary | ICD-10-CM | POA: Diagnosis not present

## 2022-08-03 DIAGNOSIS — C9110 Chronic lymphocytic leukemia of B-cell type not having achieved remission: Secondary | ICD-10-CM | POA: Diagnosis not present

## 2022-08-03 DIAGNOSIS — D761 Hemophagocytic lymphohistiocytosis: Secondary | ICD-10-CM | POA: Diagnosis not present

## 2022-08-03 DIAGNOSIS — A0472 Enterocolitis due to Clostridium difficile, not specified as recurrent: Secondary | ICD-10-CM | POA: Diagnosis not present

## 2022-08-04 DIAGNOSIS — D761 Hemophagocytic lymphohistiocytosis: Secondary | ICD-10-CM | POA: Diagnosis not present

## 2022-08-04 DIAGNOSIS — C9110 Chronic lymphocytic leukemia of B-cell type not having achieved remission: Secondary | ICD-10-CM | POA: Diagnosis not present

## 2022-08-04 DIAGNOSIS — K683 Retroperitoneal hematoma: Secondary | ICD-10-CM | POA: Diagnosis not present

## 2022-08-04 DIAGNOSIS — A0472 Enterocolitis due to Clostridium difficile, not specified as recurrent: Secondary | ICD-10-CM | POA: Diagnosis not present

## 2022-08-05 DIAGNOSIS — R140 Abdominal distension (gaseous): Secondary | ICD-10-CM | POA: Diagnosis not present

## 2022-08-05 DIAGNOSIS — K683 Retroperitoneal hematoma: Secondary | ICD-10-CM | POA: Diagnosis not present

## 2022-08-05 DIAGNOSIS — C801 Malignant (primary) neoplasm, unspecified: Secondary | ICD-10-CM | POA: Diagnosis not present

## 2022-08-05 DIAGNOSIS — C9110 Chronic lymphocytic leukemia of B-cell type not having achieved remission: Secondary | ICD-10-CM | POA: Diagnosis not present

## 2022-08-05 DIAGNOSIS — A0472 Enterocolitis due to Clostridium difficile, not specified as recurrent: Secondary | ICD-10-CM | POA: Diagnosis not present

## 2022-08-05 DIAGNOSIS — D761 Hemophagocytic lymphohistiocytosis: Secondary | ICD-10-CM | POA: Diagnosis not present

## 2022-08-05 DIAGNOSIS — R109 Unspecified abdominal pain: Secondary | ICD-10-CM | POA: Diagnosis not present

## 2022-08-06 DIAGNOSIS — K683 Retroperitoneal hematoma: Secondary | ICD-10-CM | POA: Diagnosis not present

## 2022-08-06 DIAGNOSIS — E039 Hypothyroidism, unspecified: Secondary | ICD-10-CM | POA: Diagnosis not present

## 2022-08-06 DIAGNOSIS — D761 Hemophagocytic lymphohistiocytosis: Secondary | ICD-10-CM | POA: Diagnosis not present

## 2022-08-07 DIAGNOSIS — K683 Retroperitoneal hematoma: Secondary | ICD-10-CM | POA: Diagnosis not present

## 2022-08-07 DIAGNOSIS — A0472 Enterocolitis due to Clostridium difficile, not specified as recurrent: Secondary | ICD-10-CM | POA: Diagnosis not present

## 2022-08-07 DIAGNOSIS — C9110 Chronic lymphocytic leukemia of B-cell type not having achieved remission: Secondary | ICD-10-CM | POA: Diagnosis not present

## 2022-08-07 DIAGNOSIS — D761 Hemophagocytic lymphohistiocytosis: Secondary | ICD-10-CM | POA: Diagnosis not present

## 2022-08-08 DIAGNOSIS — D761 Hemophagocytic lymphohistiocytosis: Secondary | ICD-10-CM | POA: Diagnosis not present

## 2022-08-08 DIAGNOSIS — K683 Retroperitoneal hematoma: Secondary | ICD-10-CM | POA: Diagnosis not present

## 2022-08-08 DIAGNOSIS — J90 Pleural effusion, not elsewhere classified: Secondary | ICD-10-CM | POA: Diagnosis not present

## 2022-08-08 DIAGNOSIS — R0902 Hypoxemia: Secondary | ICD-10-CM | POA: Diagnosis not present

## 2022-08-08 DIAGNOSIS — J9811 Atelectasis: Secondary | ICD-10-CM | POA: Diagnosis not present

## 2022-08-09 DIAGNOSIS — D761 Hemophagocytic lymphohistiocytosis: Secondary | ICD-10-CM | POA: Diagnosis not present

## 2022-08-09 DIAGNOSIS — D682 Hereditary deficiency of other clotting factors: Secondary | ICD-10-CM | POA: Diagnosis not present

## 2022-08-09 DIAGNOSIS — K683 Retroperitoneal hematoma: Secondary | ICD-10-CM | POA: Diagnosis not present

## 2022-08-09 DIAGNOSIS — C9110 Chronic lymphocytic leukemia of B-cell type not having achieved remission: Secondary | ICD-10-CM | POA: Diagnosis not present

## 2022-08-10 DIAGNOSIS — D61818 Other pancytopenia: Secondary | ICD-10-CM | POA: Diagnosis not present

## 2022-08-10 DIAGNOSIS — C9110 Chronic lymphocytic leukemia of B-cell type not having achieved remission: Secondary | ICD-10-CM | POA: Diagnosis not present

## 2022-08-10 DIAGNOSIS — K683 Retroperitoneal hematoma: Secondary | ICD-10-CM | POA: Diagnosis not present

## 2022-08-10 DIAGNOSIS — D761 Hemophagocytic lymphohistiocytosis: Secondary | ICD-10-CM | POA: Diagnosis not present

## 2022-08-11 DIAGNOSIS — K683 Retroperitoneal hematoma: Secondary | ICD-10-CM | POA: Diagnosis not present

## 2022-08-11 DIAGNOSIS — D761 Hemophagocytic lymphohistiocytosis: Secondary | ICD-10-CM | POA: Diagnosis not present

## 2022-08-11 DIAGNOSIS — D61818 Other pancytopenia: Secondary | ICD-10-CM | POA: Diagnosis not present

## 2022-08-11 DIAGNOSIS — C801 Malignant (primary) neoplasm, unspecified: Secondary | ICD-10-CM | POA: Diagnosis not present

## 2022-08-11 DIAGNOSIS — C9110 Chronic lymphocytic leukemia of B-cell type not having achieved remission: Secondary | ICD-10-CM | POA: Diagnosis not present

## 2022-08-12 DIAGNOSIS — D61818 Other pancytopenia: Secondary | ICD-10-CM | POA: Diagnosis not present

## 2022-08-12 DIAGNOSIS — K683 Retroperitoneal hematoma: Secondary | ICD-10-CM | POA: Diagnosis not present

## 2022-08-12 DIAGNOSIS — C9110 Chronic lymphocytic leukemia of B-cell type not having achieved remission: Secondary | ICD-10-CM | POA: Diagnosis not present

## 2022-08-12 DIAGNOSIS — K7200 Acute and subacute hepatic failure without coma: Secondary | ICD-10-CM | POA: Diagnosis not present

## 2022-08-13 DIAGNOSIS — K683 Retroperitoneal hematoma: Secondary | ICD-10-CM | POA: Diagnosis not present

## 2022-08-13 DIAGNOSIS — D61818 Other pancytopenia: Secondary | ICD-10-CM | POA: Diagnosis not present

## 2022-08-13 DIAGNOSIS — E039 Hypothyroidism, unspecified: Secondary | ICD-10-CM | POA: Diagnosis not present

## 2022-08-13 DIAGNOSIS — C9110 Chronic lymphocytic leukemia of B-cell type not having achieved remission: Secondary | ICD-10-CM | POA: Diagnosis not present

## 2022-08-13 DIAGNOSIS — K7200 Acute and subacute hepatic failure without coma: Secondary | ICD-10-CM | POA: Diagnosis not present

## 2022-08-14 DIAGNOSIS — D61818 Other pancytopenia: Secondary | ICD-10-CM | POA: Diagnosis not present

## 2022-08-14 DIAGNOSIS — K683 Retroperitoneal hematoma: Secondary | ICD-10-CM | POA: Diagnosis not present

## 2022-08-14 DIAGNOSIS — C9110 Chronic lymphocytic leukemia of B-cell type not having achieved remission: Secondary | ICD-10-CM | POA: Diagnosis not present

## 2022-08-14 DIAGNOSIS — K7200 Acute and subacute hepatic failure without coma: Secondary | ICD-10-CM | POA: Diagnosis not present

## 2022-08-15 DIAGNOSIS — D61818 Other pancytopenia: Secondary | ICD-10-CM | POA: Diagnosis not present

## 2022-08-15 DIAGNOSIS — K7200 Acute and subacute hepatic failure without coma: Secondary | ICD-10-CM | POA: Diagnosis not present

## 2022-08-15 DIAGNOSIS — C9110 Chronic lymphocytic leukemia of B-cell type not having achieved remission: Secondary | ICD-10-CM | POA: Diagnosis not present

## 2022-08-15 DIAGNOSIS — K683 Retroperitoneal hematoma: Secondary | ICD-10-CM | POA: Diagnosis not present

## 2022-08-16 DIAGNOSIS — K7200 Acute and subacute hepatic failure without coma: Secondary | ICD-10-CM | POA: Diagnosis not present

## 2022-08-16 DIAGNOSIS — D8489 Other immunodeficiencies: Secondary | ICD-10-CM | POA: Diagnosis not present

## 2022-08-16 DIAGNOSIS — R1031 Right lower quadrant pain: Secondary | ICD-10-CM | POA: Diagnosis not present

## 2022-08-16 DIAGNOSIS — D61810 Antineoplastic chemotherapy induced pancytopenia: Secondary | ICD-10-CM | POA: Diagnosis not present

## 2022-08-16 DIAGNOSIS — I82402 Acute embolism and thrombosis of unspecified deep veins of left lower extremity: Secondary | ICD-10-CM | POA: Diagnosis not present

## 2022-08-16 DIAGNOSIS — I82C11 Acute embolism and thrombosis of right internal jugular vein: Secondary | ICD-10-CM | POA: Diagnosis not present

## 2022-08-16 DIAGNOSIS — M6281 Muscle weakness (generalized): Secondary | ICD-10-CM | POA: Diagnosis not present

## 2022-08-16 DIAGNOSIS — C9110 Chronic lymphocytic leukemia of B-cell type not having achieved remission: Secondary | ICD-10-CM | POA: Diagnosis not present

## 2022-08-16 DIAGNOSIS — Z9221 Personal history of antineoplastic chemotherapy: Secondary | ICD-10-CM | POA: Diagnosis not present

## 2022-08-16 DIAGNOSIS — I951 Orthostatic hypotension: Secondary | ICD-10-CM | POA: Diagnosis not present

## 2022-08-16 DIAGNOSIS — R279 Unspecified lack of coordination: Secondary | ICD-10-CM | POA: Diagnosis not present

## 2022-08-16 DIAGNOSIS — D61818 Other pancytopenia: Secondary | ICD-10-CM | POA: Diagnosis not present

## 2022-08-16 DIAGNOSIS — R7989 Other specified abnormal findings of blood chemistry: Secondary | ICD-10-CM | POA: Diagnosis not present

## 2022-08-16 DIAGNOSIS — Z7409 Other reduced mobility: Secondary | ICD-10-CM | POA: Diagnosis not present

## 2022-08-16 DIAGNOSIS — A499 Bacterial infection, unspecified: Secondary | ICD-10-CM | POA: Diagnosis not present

## 2022-08-16 DIAGNOSIS — E43 Unspecified severe protein-calorie malnutrition: Secondary | ICD-10-CM | POA: Diagnosis not present

## 2022-08-16 DIAGNOSIS — N179 Acute kidney failure, unspecified: Secondary | ICD-10-CM | POA: Diagnosis not present

## 2022-08-16 DIAGNOSIS — D849 Immunodeficiency, unspecified: Secondary | ICD-10-CM | POA: Diagnosis not present

## 2022-08-16 DIAGNOSIS — K631 Perforation of intestine (nontraumatic): Secondary | ICD-10-CM | POA: Diagnosis not present

## 2022-08-16 DIAGNOSIS — I959 Hypotension, unspecified: Secondary | ICD-10-CM | POA: Diagnosis not present

## 2022-08-16 DIAGNOSIS — I82C21 Chronic embolism and thrombosis of right internal jugular vein: Secondary | ICD-10-CM | POA: Diagnosis not present

## 2022-08-16 DIAGNOSIS — B370 Candidal stomatitis: Secondary | ICD-10-CM | POA: Diagnosis not present

## 2022-08-16 DIAGNOSIS — I824Z2 Acute embolism and thrombosis of unspecified deep veins of left distal lower extremity: Secondary | ICD-10-CM | POA: Diagnosis not present

## 2022-08-16 DIAGNOSIS — K683 Retroperitoneal hematoma: Secondary | ICD-10-CM | POA: Diagnosis not present

## 2022-08-16 DIAGNOSIS — R5381 Other malaise: Secondary | ICD-10-CM | POA: Diagnosis not present

## 2022-08-16 DIAGNOSIS — E039 Hypothyroidism, unspecified: Secondary | ICD-10-CM | POA: Diagnosis not present

## 2022-08-16 DIAGNOSIS — G8929 Other chronic pain: Secondary | ICD-10-CM | POA: Diagnosis not present

## 2022-08-16 DIAGNOSIS — D761 Hemophagocytic lymphohistiocytosis: Secondary | ICD-10-CM | POA: Diagnosis not present

## 2022-08-16 DIAGNOSIS — D8481 Immunodeficiency due to conditions classified elsewhere: Secondary | ICD-10-CM | POA: Diagnosis not present

## 2022-08-16 DIAGNOSIS — R600 Localized edema: Secondary | ICD-10-CM | POA: Diagnosis not present

## 2022-08-16 DIAGNOSIS — G893 Neoplasm related pain (acute) (chronic): Secondary | ICD-10-CM | POA: Diagnosis not present

## 2022-08-16 DIAGNOSIS — M549 Dorsalgia, unspecified: Secondary | ICD-10-CM | POA: Diagnosis not present

## 2022-08-16 DIAGNOSIS — K261 Acute duodenal ulcer with perforation: Secondary | ICD-10-CM | POA: Diagnosis not present

## 2022-08-17 DIAGNOSIS — I824Z2 Acute embolism and thrombosis of unspecified deep veins of left distal lower extremity: Secondary | ICD-10-CM | POA: Diagnosis not present

## 2022-08-17 DIAGNOSIS — D849 Immunodeficiency, unspecified: Secondary | ICD-10-CM | POA: Diagnosis not present

## 2022-08-17 DIAGNOSIS — G8929 Other chronic pain: Secondary | ICD-10-CM | POA: Diagnosis not present

## 2022-08-17 DIAGNOSIS — D61818 Other pancytopenia: Secondary | ICD-10-CM | POA: Diagnosis not present

## 2022-08-17 DIAGNOSIS — I82C11 Acute embolism and thrombosis of right internal jugular vein: Secondary | ICD-10-CM | POA: Diagnosis not present

## 2022-08-17 DIAGNOSIS — K261 Acute duodenal ulcer with perforation: Secondary | ICD-10-CM | POA: Diagnosis not present

## 2022-08-17 DIAGNOSIS — D761 Hemophagocytic lymphohistiocytosis: Secondary | ICD-10-CM | POA: Diagnosis not present

## 2022-08-17 DIAGNOSIS — Z7409 Other reduced mobility: Secondary | ICD-10-CM | POA: Diagnosis not present

## 2022-08-17 DIAGNOSIS — Z9221 Personal history of antineoplastic chemotherapy: Secondary | ICD-10-CM | POA: Diagnosis not present

## 2022-08-17 DIAGNOSIS — G893 Neoplasm related pain (acute) (chronic): Secondary | ICD-10-CM | POA: Diagnosis not present

## 2022-08-17 DIAGNOSIS — N179 Acute kidney failure, unspecified: Secondary | ICD-10-CM | POA: Diagnosis not present

## 2022-08-17 DIAGNOSIS — K683 Retroperitoneal hematoma: Secondary | ICD-10-CM | POA: Diagnosis not present

## 2022-08-17 DIAGNOSIS — C9110 Chronic lymphocytic leukemia of B-cell type not having achieved remission: Secondary | ICD-10-CM | POA: Diagnosis not present

## 2022-08-17 DIAGNOSIS — I959 Hypotension, unspecified: Secondary | ICD-10-CM | POA: Diagnosis not present

## 2022-08-17 DIAGNOSIS — E039 Hypothyroidism, unspecified: Secondary | ICD-10-CM | POA: Diagnosis not present

## 2022-08-17 DIAGNOSIS — R5381 Other malaise: Secondary | ICD-10-CM | POA: Diagnosis not present

## 2022-08-18 DIAGNOSIS — N179 Acute kidney failure, unspecified: Secondary | ICD-10-CM | POA: Diagnosis not present

## 2022-08-18 DIAGNOSIS — D761 Hemophagocytic lymphohistiocytosis: Secondary | ICD-10-CM | POA: Diagnosis not present

## 2022-08-18 DIAGNOSIS — Z9221 Personal history of antineoplastic chemotherapy: Secondary | ICD-10-CM | POA: Diagnosis not present

## 2022-08-18 DIAGNOSIS — C9110 Chronic lymphocytic leukemia of B-cell type not having achieved remission: Secondary | ICD-10-CM | POA: Diagnosis not present

## 2022-08-18 DIAGNOSIS — Z7409 Other reduced mobility: Secondary | ICD-10-CM | POA: Diagnosis not present

## 2022-08-18 DIAGNOSIS — R5381 Other malaise: Secondary | ICD-10-CM | POA: Diagnosis not present

## 2022-08-18 DIAGNOSIS — G893 Neoplasm related pain (acute) (chronic): Secondary | ICD-10-CM | POA: Diagnosis not present

## 2022-08-18 DIAGNOSIS — K683 Retroperitoneal hematoma: Secondary | ICD-10-CM | POA: Diagnosis not present

## 2022-08-18 DIAGNOSIS — K261 Acute duodenal ulcer with perforation: Secondary | ICD-10-CM | POA: Diagnosis not present

## 2022-08-19 DIAGNOSIS — D761 Hemophagocytic lymphohistiocytosis: Secondary | ICD-10-CM | POA: Diagnosis not present

## 2022-08-19 DIAGNOSIS — Z7409 Other reduced mobility: Secondary | ICD-10-CM | POA: Diagnosis not present

## 2022-08-19 DIAGNOSIS — Z9221 Personal history of antineoplastic chemotherapy: Secondary | ICD-10-CM | POA: Diagnosis not present

## 2022-08-19 DIAGNOSIS — C9110 Chronic lymphocytic leukemia of B-cell type not having achieved remission: Secondary | ICD-10-CM | POA: Diagnosis not present

## 2022-08-19 DIAGNOSIS — R5381 Other malaise: Secondary | ICD-10-CM | POA: Diagnosis not present

## 2022-08-19 DIAGNOSIS — G893 Neoplasm related pain (acute) (chronic): Secondary | ICD-10-CM | POA: Diagnosis not present

## 2022-08-19 DIAGNOSIS — K683 Retroperitoneal hematoma: Secondary | ICD-10-CM | POA: Diagnosis not present

## 2022-08-19 DIAGNOSIS — K261 Acute duodenal ulcer with perforation: Secondary | ICD-10-CM | POA: Diagnosis not present

## 2022-08-19 DIAGNOSIS — D849 Immunodeficiency, unspecified: Secondary | ICD-10-CM | POA: Diagnosis not present

## 2022-08-20 DIAGNOSIS — R5381 Other malaise: Secondary | ICD-10-CM | POA: Diagnosis not present

## 2022-08-20 DIAGNOSIS — K631 Perforation of intestine (nontraumatic): Secondary | ICD-10-CM | POA: Diagnosis not present

## 2022-08-20 DIAGNOSIS — K261 Acute duodenal ulcer with perforation: Secondary | ICD-10-CM | POA: Diagnosis not present

## 2022-08-20 DIAGNOSIS — C9110 Chronic lymphocytic leukemia of B-cell type not having achieved remission: Secondary | ICD-10-CM | POA: Diagnosis not present

## 2022-08-20 DIAGNOSIS — M549 Dorsalgia, unspecified: Secondary | ICD-10-CM | POA: Diagnosis not present

## 2022-08-20 DIAGNOSIS — G8929 Other chronic pain: Secondary | ICD-10-CM | POA: Diagnosis not present

## 2022-08-20 DIAGNOSIS — D761 Hemophagocytic lymphohistiocytosis: Secondary | ICD-10-CM | POA: Diagnosis not present

## 2022-08-20 DIAGNOSIS — D849 Immunodeficiency, unspecified: Secondary | ICD-10-CM | POA: Diagnosis not present

## 2022-08-20 DIAGNOSIS — G893 Neoplasm related pain (acute) (chronic): Secondary | ICD-10-CM | POA: Diagnosis not present

## 2022-08-20 DIAGNOSIS — Z7409 Other reduced mobility: Secondary | ICD-10-CM | POA: Diagnosis not present

## 2022-08-20 DIAGNOSIS — K683 Retroperitoneal hematoma: Secondary | ICD-10-CM | POA: Diagnosis not present

## 2022-08-20 DIAGNOSIS — I959 Hypotension, unspecified: Secondary | ICD-10-CM | POA: Diagnosis not present

## 2022-08-20 DIAGNOSIS — E039 Hypothyroidism, unspecified: Secondary | ICD-10-CM | POA: Diagnosis not present

## 2022-08-20 DIAGNOSIS — N179 Acute kidney failure, unspecified: Secondary | ICD-10-CM | POA: Diagnosis not present

## 2022-08-20 DIAGNOSIS — E43 Unspecified severe protein-calorie malnutrition: Secondary | ICD-10-CM | POA: Diagnosis not present

## 2022-08-21 DIAGNOSIS — K683 Retroperitoneal hematoma: Secondary | ICD-10-CM | POA: Diagnosis not present

## 2022-08-21 DIAGNOSIS — D761 Hemophagocytic lymphohistiocytosis: Secondary | ICD-10-CM | POA: Diagnosis not present

## 2022-08-21 DIAGNOSIS — K261 Acute duodenal ulcer with perforation: Secondary | ICD-10-CM | POA: Diagnosis not present

## 2022-08-21 DIAGNOSIS — I959 Hypotension, unspecified: Secondary | ICD-10-CM | POA: Diagnosis not present

## 2022-08-21 DIAGNOSIS — C9110 Chronic lymphocytic leukemia of B-cell type not having achieved remission: Secondary | ICD-10-CM | POA: Diagnosis not present

## 2022-08-21 DIAGNOSIS — G893 Neoplasm related pain (acute) (chronic): Secondary | ICD-10-CM | POA: Diagnosis not present

## 2022-08-21 DIAGNOSIS — R5381 Other malaise: Secondary | ICD-10-CM | POA: Diagnosis not present

## 2022-08-21 DIAGNOSIS — D849 Immunodeficiency, unspecified: Secondary | ICD-10-CM | POA: Diagnosis not present

## 2022-08-21 DIAGNOSIS — Z7409 Other reduced mobility: Secondary | ICD-10-CM | POA: Diagnosis not present

## 2022-08-22 DIAGNOSIS — K261 Acute duodenal ulcer with perforation: Secondary | ICD-10-CM | POA: Diagnosis not present

## 2022-08-22 DIAGNOSIS — R5381 Other malaise: Secondary | ICD-10-CM | POA: Diagnosis not present

## 2022-08-22 DIAGNOSIS — G893 Neoplasm related pain (acute) (chronic): Secondary | ICD-10-CM | POA: Diagnosis not present

## 2022-08-22 DIAGNOSIS — C9110 Chronic lymphocytic leukemia of B-cell type not having achieved remission: Secondary | ICD-10-CM | POA: Diagnosis not present

## 2022-08-22 DIAGNOSIS — Z7409 Other reduced mobility: Secondary | ICD-10-CM | POA: Diagnosis not present

## 2022-08-22 DIAGNOSIS — Z9221 Personal history of antineoplastic chemotherapy: Secondary | ICD-10-CM | POA: Diagnosis not present

## 2022-08-22 DIAGNOSIS — D761 Hemophagocytic lymphohistiocytosis: Secondary | ICD-10-CM | POA: Diagnosis not present

## 2022-08-22 DIAGNOSIS — D849 Immunodeficiency, unspecified: Secondary | ICD-10-CM | POA: Diagnosis not present

## 2022-08-22 DIAGNOSIS — K683 Retroperitoneal hematoma: Secondary | ICD-10-CM | POA: Diagnosis not present

## 2022-08-23 DIAGNOSIS — Z9221 Personal history of antineoplastic chemotherapy: Secondary | ICD-10-CM | POA: Diagnosis not present

## 2022-08-23 DIAGNOSIS — R5381 Other malaise: Secondary | ICD-10-CM | POA: Diagnosis not present

## 2022-08-23 DIAGNOSIS — Z7409 Other reduced mobility: Secondary | ICD-10-CM | POA: Diagnosis not present

## 2022-08-23 DIAGNOSIS — K261 Acute duodenal ulcer with perforation: Secondary | ICD-10-CM | POA: Diagnosis not present

## 2022-08-23 DIAGNOSIS — D761 Hemophagocytic lymphohistiocytosis: Secondary | ICD-10-CM | POA: Diagnosis not present

## 2022-08-23 DIAGNOSIS — C9110 Chronic lymphocytic leukemia of B-cell type not having achieved remission: Secondary | ICD-10-CM | POA: Diagnosis not present

## 2022-08-23 DIAGNOSIS — K683 Retroperitoneal hematoma: Secondary | ICD-10-CM | POA: Diagnosis not present

## 2022-08-23 DIAGNOSIS — D849 Immunodeficiency, unspecified: Secondary | ICD-10-CM | POA: Diagnosis not present

## 2022-08-23 DIAGNOSIS — G893 Neoplasm related pain (acute) (chronic): Secondary | ICD-10-CM | POA: Diagnosis not present

## 2022-08-24 DIAGNOSIS — K261 Acute duodenal ulcer with perforation: Secondary | ICD-10-CM | POA: Diagnosis not present

## 2022-08-24 DIAGNOSIS — R5381 Other malaise: Secondary | ICD-10-CM | POA: Diagnosis not present

## 2022-08-24 DIAGNOSIS — G893 Neoplasm related pain (acute) (chronic): Secondary | ICD-10-CM | POA: Diagnosis not present

## 2022-08-24 DIAGNOSIS — D761 Hemophagocytic lymphohistiocytosis: Secondary | ICD-10-CM | POA: Diagnosis not present

## 2022-08-24 DIAGNOSIS — C9110 Chronic lymphocytic leukemia of B-cell type not having achieved remission: Secondary | ICD-10-CM | POA: Diagnosis not present

## 2022-08-24 DIAGNOSIS — Z9221 Personal history of antineoplastic chemotherapy: Secondary | ICD-10-CM | POA: Diagnosis not present

## 2022-08-24 DIAGNOSIS — D849 Immunodeficiency, unspecified: Secondary | ICD-10-CM | POA: Diagnosis not present

## 2022-08-24 DIAGNOSIS — Z7409 Other reduced mobility: Secondary | ICD-10-CM | POA: Diagnosis not present

## 2022-08-24 DIAGNOSIS — K683 Retroperitoneal hematoma: Secondary | ICD-10-CM | POA: Diagnosis not present

## 2022-08-25 DIAGNOSIS — D849 Immunodeficiency, unspecified: Secondary | ICD-10-CM | POA: Diagnosis not present

## 2022-08-25 DIAGNOSIS — R5381 Other malaise: Secondary | ICD-10-CM | POA: Diagnosis not present

## 2022-08-25 DIAGNOSIS — K261 Acute duodenal ulcer with perforation: Secondary | ICD-10-CM | POA: Diagnosis not present

## 2022-08-25 DIAGNOSIS — C9110 Chronic lymphocytic leukemia of B-cell type not having achieved remission: Secondary | ICD-10-CM | POA: Diagnosis not present

## 2022-08-25 DIAGNOSIS — G893 Neoplasm related pain (acute) (chronic): Secondary | ICD-10-CM | POA: Diagnosis not present

## 2022-08-25 DIAGNOSIS — Z9221 Personal history of antineoplastic chemotherapy: Secondary | ICD-10-CM | POA: Diagnosis not present

## 2022-08-25 DIAGNOSIS — D761 Hemophagocytic lymphohistiocytosis: Secondary | ICD-10-CM | POA: Diagnosis not present

## 2022-08-25 DIAGNOSIS — Z7409 Other reduced mobility: Secondary | ICD-10-CM | POA: Diagnosis not present

## 2022-08-25 DIAGNOSIS — K683 Retroperitoneal hematoma: Secondary | ICD-10-CM | POA: Diagnosis not present

## 2022-08-26 DIAGNOSIS — R600 Localized edema: Secondary | ICD-10-CM | POA: Diagnosis not present

## 2022-08-26 DIAGNOSIS — I959 Hypotension, unspecified: Secondary | ICD-10-CM | POA: Diagnosis not present

## 2022-08-26 DIAGNOSIS — Z9221 Personal history of antineoplastic chemotherapy: Secondary | ICD-10-CM | POA: Diagnosis not present

## 2022-08-26 DIAGNOSIS — E039 Hypothyroidism, unspecified: Secondary | ICD-10-CM | POA: Diagnosis not present

## 2022-08-26 DIAGNOSIS — K261 Acute duodenal ulcer with perforation: Secondary | ICD-10-CM | POA: Diagnosis not present

## 2022-08-26 DIAGNOSIS — G893 Neoplasm related pain (acute) (chronic): Secondary | ICD-10-CM | POA: Diagnosis not present

## 2022-08-26 DIAGNOSIS — I82C21 Chronic embolism and thrombosis of right internal jugular vein: Secondary | ICD-10-CM | POA: Diagnosis not present

## 2022-08-26 DIAGNOSIS — I824Z2 Acute embolism and thrombosis of unspecified deep veins of left distal lower extremity: Secondary | ICD-10-CM | POA: Diagnosis not present

## 2022-08-26 DIAGNOSIS — R5381 Other malaise: Secondary | ICD-10-CM | POA: Diagnosis not present

## 2022-08-26 DIAGNOSIS — D8481 Immunodeficiency due to conditions classified elsewhere: Secondary | ICD-10-CM | POA: Diagnosis not present

## 2022-08-26 DIAGNOSIS — C9110 Chronic lymphocytic leukemia of B-cell type not having achieved remission: Secondary | ICD-10-CM | POA: Diagnosis not present

## 2022-08-26 DIAGNOSIS — K683 Retroperitoneal hematoma: Secondary | ICD-10-CM | POA: Diagnosis not present

## 2022-08-26 DIAGNOSIS — Z7409 Other reduced mobility: Secondary | ICD-10-CM | POA: Diagnosis not present

## 2022-08-26 DIAGNOSIS — D761 Hemophagocytic lymphohistiocytosis: Secondary | ICD-10-CM | POA: Diagnosis not present

## 2022-08-27 DIAGNOSIS — Z9221 Personal history of antineoplastic chemotherapy: Secondary | ICD-10-CM | POA: Diagnosis not present

## 2022-08-27 DIAGNOSIS — D849 Immunodeficiency, unspecified: Secondary | ICD-10-CM | POA: Diagnosis not present

## 2022-08-27 DIAGNOSIS — D761 Hemophagocytic lymphohistiocytosis: Secondary | ICD-10-CM | POA: Diagnosis not present

## 2022-08-27 DIAGNOSIS — Z7409 Other reduced mobility: Secondary | ICD-10-CM | POA: Diagnosis not present

## 2022-08-27 DIAGNOSIS — C9110 Chronic lymphocytic leukemia of B-cell type not having achieved remission: Secondary | ICD-10-CM | POA: Diagnosis not present

## 2022-08-27 DIAGNOSIS — K683 Retroperitoneal hematoma: Secondary | ICD-10-CM | POA: Diagnosis not present

## 2022-08-27 DIAGNOSIS — R5381 Other malaise: Secondary | ICD-10-CM | POA: Diagnosis not present

## 2022-08-27 DIAGNOSIS — G893 Neoplasm related pain (acute) (chronic): Secondary | ICD-10-CM | POA: Diagnosis not present

## 2022-08-27 DIAGNOSIS — K261 Acute duodenal ulcer with perforation: Secondary | ICD-10-CM | POA: Diagnosis not present

## 2022-08-28 DIAGNOSIS — Z7409 Other reduced mobility: Secondary | ICD-10-CM | POA: Diagnosis not present

## 2022-08-28 DIAGNOSIS — K683 Retroperitoneal hematoma: Secondary | ICD-10-CM | POA: Diagnosis not present

## 2022-08-28 DIAGNOSIS — C9110 Chronic lymphocytic leukemia of B-cell type not having achieved remission: Secondary | ICD-10-CM | POA: Diagnosis not present

## 2022-08-28 DIAGNOSIS — R5381 Other malaise: Secondary | ICD-10-CM | POA: Diagnosis not present

## 2022-08-28 DIAGNOSIS — K261 Acute duodenal ulcer with perforation: Secondary | ICD-10-CM | POA: Diagnosis not present

## 2022-08-28 DIAGNOSIS — G893 Neoplasm related pain (acute) (chronic): Secondary | ICD-10-CM | POA: Diagnosis not present

## 2022-08-28 DIAGNOSIS — Z9221 Personal history of antineoplastic chemotherapy: Secondary | ICD-10-CM | POA: Diagnosis not present

## 2022-08-28 DIAGNOSIS — D849 Immunodeficiency, unspecified: Secondary | ICD-10-CM | POA: Diagnosis not present

## 2022-08-28 DIAGNOSIS — D761 Hemophagocytic lymphohistiocytosis: Secondary | ICD-10-CM | POA: Diagnosis not present

## 2022-08-29 DIAGNOSIS — D849 Immunodeficiency, unspecified: Secondary | ICD-10-CM | POA: Diagnosis not present

## 2022-08-29 DIAGNOSIS — K261 Acute duodenal ulcer with perforation: Secondary | ICD-10-CM | POA: Diagnosis not present

## 2022-08-29 DIAGNOSIS — R5381 Other malaise: Secondary | ICD-10-CM | POA: Diagnosis not present

## 2022-08-29 DIAGNOSIS — Z7409 Other reduced mobility: Secondary | ICD-10-CM | POA: Diagnosis not present

## 2022-08-29 DIAGNOSIS — Z9221 Personal history of antineoplastic chemotherapy: Secondary | ICD-10-CM | POA: Diagnosis not present

## 2022-08-29 DIAGNOSIS — C9110 Chronic lymphocytic leukemia of B-cell type not having achieved remission: Secondary | ICD-10-CM | POA: Diagnosis not present

## 2022-08-29 DIAGNOSIS — G893 Neoplasm related pain (acute) (chronic): Secondary | ICD-10-CM | POA: Diagnosis not present

## 2022-08-29 DIAGNOSIS — K683 Retroperitoneal hematoma: Secondary | ICD-10-CM | POA: Diagnosis not present

## 2022-08-29 DIAGNOSIS — D761 Hemophagocytic lymphohistiocytosis: Secondary | ICD-10-CM | POA: Diagnosis not present

## 2022-08-30 DIAGNOSIS — C9110 Chronic lymphocytic leukemia of B-cell type not having achieved remission: Secondary | ICD-10-CM | POA: Diagnosis not present

## 2022-08-30 DIAGNOSIS — R5381 Other malaise: Secondary | ICD-10-CM | POA: Diagnosis not present

## 2022-08-30 DIAGNOSIS — Z7409 Other reduced mobility: Secondary | ICD-10-CM | POA: Diagnosis not present

## 2022-08-30 DIAGNOSIS — D849 Immunodeficiency, unspecified: Secondary | ICD-10-CM | POA: Diagnosis not present

## 2022-08-30 DIAGNOSIS — D761 Hemophagocytic lymphohistiocytosis: Secondary | ICD-10-CM | POA: Diagnosis not present

## 2022-08-30 DIAGNOSIS — G893 Neoplasm related pain (acute) (chronic): Secondary | ICD-10-CM | POA: Diagnosis not present

## 2022-08-30 DIAGNOSIS — Z9221 Personal history of antineoplastic chemotherapy: Secondary | ICD-10-CM | POA: Diagnosis not present

## 2022-08-30 DIAGNOSIS — K261 Acute duodenal ulcer with perforation: Secondary | ICD-10-CM | POA: Diagnosis not present

## 2022-08-30 DIAGNOSIS — K683 Retroperitoneal hematoma: Secondary | ICD-10-CM | POA: Diagnosis not present

## 2022-08-31 DIAGNOSIS — Z9221 Personal history of antineoplastic chemotherapy: Secondary | ICD-10-CM | POA: Diagnosis not present

## 2022-08-31 DIAGNOSIS — R5381 Other malaise: Secondary | ICD-10-CM | POA: Diagnosis not present

## 2022-08-31 DIAGNOSIS — K683 Retroperitoneal hematoma: Secondary | ICD-10-CM | POA: Diagnosis not present

## 2022-08-31 DIAGNOSIS — K261 Acute duodenal ulcer with perforation: Secondary | ICD-10-CM | POA: Diagnosis not present

## 2022-08-31 DIAGNOSIS — C9110 Chronic lymphocytic leukemia of B-cell type not having achieved remission: Secondary | ICD-10-CM | POA: Diagnosis not present

## 2022-08-31 DIAGNOSIS — G893 Neoplasm related pain (acute) (chronic): Secondary | ICD-10-CM | POA: Diagnosis not present

## 2022-08-31 DIAGNOSIS — D849 Immunodeficiency, unspecified: Secondary | ICD-10-CM | POA: Diagnosis not present

## 2022-08-31 DIAGNOSIS — D761 Hemophagocytic lymphohistiocytosis: Secondary | ICD-10-CM | POA: Diagnosis not present

## 2022-08-31 DIAGNOSIS — Z7409 Other reduced mobility: Secondary | ICD-10-CM | POA: Diagnosis not present

## 2022-09-01 DIAGNOSIS — D849 Immunodeficiency, unspecified: Secondary | ICD-10-CM | POA: Diagnosis not present

## 2022-09-01 DIAGNOSIS — K683 Retroperitoneal hematoma: Secondary | ICD-10-CM | POA: Diagnosis not present

## 2022-09-01 DIAGNOSIS — G893 Neoplasm related pain (acute) (chronic): Secondary | ICD-10-CM | POA: Diagnosis not present

## 2022-09-01 DIAGNOSIS — K261 Acute duodenal ulcer with perforation: Secondary | ICD-10-CM | POA: Diagnosis not present

## 2022-09-01 DIAGNOSIS — C9110 Chronic lymphocytic leukemia of B-cell type not having achieved remission: Secondary | ICD-10-CM | POA: Diagnosis not present

## 2022-09-01 DIAGNOSIS — R5381 Other malaise: Secondary | ICD-10-CM | POA: Diagnosis not present

## 2022-09-01 DIAGNOSIS — D761 Hemophagocytic lymphohistiocytosis: Secondary | ICD-10-CM | POA: Diagnosis not present

## 2022-09-01 DIAGNOSIS — Z9221 Personal history of antineoplastic chemotherapy: Secondary | ICD-10-CM | POA: Diagnosis not present

## 2022-09-01 DIAGNOSIS — Z7409 Other reduced mobility: Secondary | ICD-10-CM | POA: Diagnosis not present

## 2022-09-02 DIAGNOSIS — C9110 Chronic lymphocytic leukemia of B-cell type not having achieved remission: Secondary | ICD-10-CM | POA: Diagnosis not present

## 2022-09-02 DIAGNOSIS — R5381 Other malaise: Secondary | ICD-10-CM | POA: Diagnosis not present

## 2022-09-02 DIAGNOSIS — D849 Immunodeficiency, unspecified: Secondary | ICD-10-CM | POA: Diagnosis not present

## 2022-09-02 DIAGNOSIS — D761 Hemophagocytic lymphohistiocytosis: Secondary | ICD-10-CM | POA: Diagnosis not present

## 2022-09-02 DIAGNOSIS — G893 Neoplasm related pain (acute) (chronic): Secondary | ICD-10-CM | POA: Diagnosis not present

## 2022-09-02 DIAGNOSIS — K683 Retroperitoneal hematoma: Secondary | ICD-10-CM | POA: Diagnosis not present

## 2022-09-02 DIAGNOSIS — K261 Acute duodenal ulcer with perforation: Secondary | ICD-10-CM | POA: Diagnosis not present

## 2022-09-02 DIAGNOSIS — Z7409 Other reduced mobility: Secondary | ICD-10-CM | POA: Diagnosis not present

## 2022-09-02 DIAGNOSIS — Z9221 Personal history of antineoplastic chemotherapy: Secondary | ICD-10-CM | POA: Diagnosis not present

## 2022-09-03 DIAGNOSIS — C9110 Chronic lymphocytic leukemia of B-cell type not having achieved remission: Secondary | ICD-10-CM | POA: Diagnosis not present

## 2022-09-03 DIAGNOSIS — K683 Retroperitoneal hematoma: Secondary | ICD-10-CM | POA: Diagnosis not present

## 2022-09-03 DIAGNOSIS — K261 Acute duodenal ulcer with perforation: Secondary | ICD-10-CM | POA: Diagnosis not present

## 2022-09-03 DIAGNOSIS — R5381 Other malaise: Secondary | ICD-10-CM | POA: Diagnosis not present

## 2022-09-03 DIAGNOSIS — G893 Neoplasm related pain (acute) (chronic): Secondary | ICD-10-CM | POA: Diagnosis not present

## 2022-09-03 DIAGNOSIS — Z9221 Personal history of antineoplastic chemotherapy: Secondary | ICD-10-CM | POA: Diagnosis not present

## 2022-09-03 DIAGNOSIS — D761 Hemophagocytic lymphohistiocytosis: Secondary | ICD-10-CM | POA: Diagnosis not present

## 2022-09-03 DIAGNOSIS — D849 Immunodeficiency, unspecified: Secondary | ICD-10-CM | POA: Diagnosis not present

## 2022-09-03 DIAGNOSIS — Z7409 Other reduced mobility: Secondary | ICD-10-CM | POA: Diagnosis not present

## 2022-09-04 DIAGNOSIS — Z7409 Other reduced mobility: Secondary | ICD-10-CM | POA: Diagnosis not present

## 2022-09-04 DIAGNOSIS — D761 Hemophagocytic lymphohistiocytosis: Secondary | ICD-10-CM | POA: Diagnosis not present

## 2022-09-04 DIAGNOSIS — G893 Neoplasm related pain (acute) (chronic): Secondary | ICD-10-CM | POA: Diagnosis not present

## 2022-09-04 DIAGNOSIS — Z9221 Personal history of antineoplastic chemotherapy: Secondary | ICD-10-CM | POA: Diagnosis not present

## 2022-09-04 DIAGNOSIS — D849 Immunodeficiency, unspecified: Secondary | ICD-10-CM | POA: Diagnosis not present

## 2022-09-04 DIAGNOSIS — K683 Retroperitoneal hematoma: Secondary | ICD-10-CM | POA: Diagnosis not present

## 2022-09-04 DIAGNOSIS — C9110 Chronic lymphocytic leukemia of B-cell type not having achieved remission: Secondary | ICD-10-CM | POA: Diagnosis not present

## 2022-09-04 DIAGNOSIS — K261 Acute duodenal ulcer with perforation: Secondary | ICD-10-CM | POA: Diagnosis not present

## 2022-09-04 DIAGNOSIS — R5381 Other malaise: Secondary | ICD-10-CM | POA: Diagnosis not present

## 2022-09-05 DIAGNOSIS — C9110 Chronic lymphocytic leukemia of B-cell type not having achieved remission: Secondary | ICD-10-CM | POA: Diagnosis not present

## 2022-09-05 DIAGNOSIS — Z9221 Personal history of antineoplastic chemotherapy: Secondary | ICD-10-CM | POA: Diagnosis not present

## 2022-09-05 DIAGNOSIS — R5381 Other malaise: Secondary | ICD-10-CM | POA: Diagnosis not present

## 2022-09-05 DIAGNOSIS — D761 Hemophagocytic lymphohistiocytosis: Secondary | ICD-10-CM | POA: Diagnosis not present

## 2022-09-05 DIAGNOSIS — Z7409 Other reduced mobility: Secondary | ICD-10-CM | POA: Diagnosis not present

## 2022-09-05 DIAGNOSIS — G893 Neoplasm related pain (acute) (chronic): Secondary | ICD-10-CM | POA: Diagnosis not present

## 2022-09-05 DIAGNOSIS — D849 Immunodeficiency, unspecified: Secondary | ICD-10-CM | POA: Diagnosis not present

## 2022-09-05 DIAGNOSIS — K683 Retroperitoneal hematoma: Secondary | ICD-10-CM | POA: Diagnosis not present

## 2022-09-05 DIAGNOSIS — K261 Acute duodenal ulcer with perforation: Secondary | ICD-10-CM | POA: Diagnosis not present

## 2022-09-06 DIAGNOSIS — D761 Hemophagocytic lymphohistiocytosis: Secondary | ICD-10-CM | POA: Diagnosis not present

## 2022-09-06 DIAGNOSIS — R5381 Other malaise: Secondary | ICD-10-CM | POA: Diagnosis not present

## 2022-09-06 DIAGNOSIS — D849 Immunodeficiency, unspecified: Secondary | ICD-10-CM | POA: Diagnosis not present

## 2022-09-06 DIAGNOSIS — K261 Acute duodenal ulcer with perforation: Secondary | ICD-10-CM | POA: Diagnosis not present

## 2022-09-06 DIAGNOSIS — Z7409 Other reduced mobility: Secondary | ICD-10-CM | POA: Diagnosis not present

## 2022-09-06 DIAGNOSIS — K683 Retroperitoneal hematoma: Secondary | ICD-10-CM | POA: Diagnosis not present

## 2022-09-06 DIAGNOSIS — C9110 Chronic lymphocytic leukemia of B-cell type not having achieved remission: Secondary | ICD-10-CM | POA: Diagnosis not present

## 2022-09-06 DIAGNOSIS — G893 Neoplasm related pain (acute) (chronic): Secondary | ICD-10-CM | POA: Diagnosis not present

## 2022-09-06 DIAGNOSIS — Z9221 Personal history of antineoplastic chemotherapy: Secondary | ICD-10-CM | POA: Diagnosis not present

## 2022-10-02 DIAGNOSIS — R5381 Other malaise: Secondary | ICD-10-CM | POA: Diagnosis not present

## 2022-10-02 DIAGNOSIS — Z7409 Other reduced mobility: Secondary | ICD-10-CM | POA: Diagnosis not present

## 2022-10-02 DIAGNOSIS — R2681 Unsteadiness on feet: Secondary | ICD-10-CM | POA: Diagnosis not present

## 2022-10-02 DIAGNOSIS — R531 Weakness: Secondary | ICD-10-CM | POA: Diagnosis not present

## 2022-10-03 DIAGNOSIS — D709 Neutropenia, unspecified: Secondary | ICD-10-CM | POA: Diagnosis not present

## 2022-10-03 DIAGNOSIS — K828 Other specified diseases of gallbladder: Secondary | ICD-10-CM | POA: Diagnosis not present

## 2022-10-03 DIAGNOSIS — R161 Splenomegaly, not elsewhere classified: Secondary | ICD-10-CM | POA: Diagnosis not present

## 2022-10-03 DIAGNOSIS — C801 Malignant (primary) neoplasm, unspecified: Secondary | ICD-10-CM | POA: Diagnosis not present

## 2022-10-03 DIAGNOSIS — E039 Hypothyroidism, unspecified: Secondary | ICD-10-CM | POA: Diagnosis not present

## 2022-10-03 DIAGNOSIS — K7689 Other specified diseases of liver: Secondary | ICD-10-CM | POA: Diagnosis not present

## 2022-10-03 DIAGNOSIS — D761 Hemophagocytic lymphohistiocytosis: Secondary | ICD-10-CM | POA: Diagnosis not present

## 2022-10-03 DIAGNOSIS — D849 Immunodeficiency, unspecified: Secondary | ICD-10-CM | POA: Diagnosis not present

## 2022-10-03 DIAGNOSIS — Z882 Allergy status to sulfonamides status: Secondary | ICD-10-CM | POA: Diagnosis not present

## 2022-10-03 DIAGNOSIS — D630 Anemia in neoplastic disease: Secondary | ICD-10-CM | POA: Diagnosis not present

## 2022-10-03 DIAGNOSIS — C9110 Chronic lymphocytic leukemia of B-cell type not having achieved remission: Secondary | ICD-10-CM | POA: Diagnosis not present

## 2022-10-03 DIAGNOSIS — Z9221 Personal history of antineoplastic chemotherapy: Secondary | ICD-10-CM | POA: Diagnosis not present

## 2022-10-03 DIAGNOSIS — Z803 Family history of malignant neoplasm of breast: Secondary | ICD-10-CM | POA: Diagnosis not present

## 2022-10-03 DIAGNOSIS — Z923 Personal history of irradiation: Secondary | ICD-10-CM | POA: Diagnosis not present

## 2022-10-09 DIAGNOSIS — R531 Weakness: Secondary | ICD-10-CM | POA: Diagnosis not present

## 2022-10-09 DIAGNOSIS — R2681 Unsteadiness on feet: Secondary | ICD-10-CM | POA: Diagnosis not present

## 2022-10-09 DIAGNOSIS — R5381 Other malaise: Secondary | ICD-10-CM | POA: Diagnosis not present

## 2022-10-09 DIAGNOSIS — Z7409 Other reduced mobility: Secondary | ICD-10-CM | POA: Diagnosis not present

## 2022-10-11 DIAGNOSIS — E039 Hypothyroidism, unspecified: Secondary | ICD-10-CM | POA: Diagnosis not present

## 2022-10-14 DIAGNOSIS — R531 Weakness: Secondary | ICD-10-CM | POA: Diagnosis not present

## 2022-10-14 DIAGNOSIS — Z7409 Other reduced mobility: Secondary | ICD-10-CM | POA: Diagnosis not present

## 2022-10-14 DIAGNOSIS — R2681 Unsteadiness on feet: Secondary | ICD-10-CM | POA: Diagnosis not present

## 2022-10-14 DIAGNOSIS — R5381 Other malaise: Secondary | ICD-10-CM | POA: Diagnosis not present

## 2022-10-16 DIAGNOSIS — R2681 Unsteadiness on feet: Secondary | ICD-10-CM | POA: Diagnosis not present

## 2022-10-16 DIAGNOSIS — R5381 Other malaise: Secondary | ICD-10-CM | POA: Diagnosis not present

## 2022-10-16 DIAGNOSIS — R531 Weakness: Secondary | ICD-10-CM | POA: Diagnosis not present

## 2022-10-16 DIAGNOSIS — Z7409 Other reduced mobility: Secondary | ICD-10-CM | POA: Diagnosis not present

## 2022-10-18 DIAGNOSIS — C9111 Chronic lymphocytic leukemia of B-cell type in remission: Secondary | ICD-10-CM | POA: Diagnosis not present

## 2022-10-18 DIAGNOSIS — R2681 Unsteadiness on feet: Secondary | ICD-10-CM | POA: Diagnosis not present

## 2022-10-18 DIAGNOSIS — Z7409 Other reduced mobility: Secondary | ICD-10-CM | POA: Diagnosis not present

## 2022-10-18 DIAGNOSIS — Z7901 Long term (current) use of anticoagulants: Secondary | ICD-10-CM | POA: Diagnosis not present

## 2022-10-18 DIAGNOSIS — R5381 Other malaise: Secondary | ICD-10-CM | POA: Diagnosis not present

## 2022-10-18 DIAGNOSIS — R531 Weakness: Secondary | ICD-10-CM | POA: Diagnosis not present

## 2022-10-18 DIAGNOSIS — Z86718 Personal history of other venous thrombosis and embolism: Secondary | ICD-10-CM | POA: Diagnosis not present

## 2022-10-22 DIAGNOSIS — R2681 Unsteadiness on feet: Secondary | ICD-10-CM | POA: Diagnosis not present

## 2022-10-22 DIAGNOSIS — L821 Other seborrheic keratosis: Secondary | ICD-10-CM | POA: Diagnosis not present

## 2022-10-22 DIAGNOSIS — Z85828 Personal history of other malignant neoplasm of skin: Secondary | ICD-10-CM | POA: Diagnosis not present

## 2022-10-22 DIAGNOSIS — L814 Other melanin hyperpigmentation: Secondary | ICD-10-CM | POA: Diagnosis not present

## 2022-10-22 DIAGNOSIS — R5381 Other malaise: Secondary | ICD-10-CM | POA: Diagnosis not present

## 2022-10-22 DIAGNOSIS — Z7409 Other reduced mobility: Secondary | ICD-10-CM | POA: Diagnosis not present

## 2022-10-22 DIAGNOSIS — L570 Actinic keratosis: Secondary | ICD-10-CM | POA: Diagnosis not present

## 2022-10-22 DIAGNOSIS — L820 Inflamed seborrheic keratosis: Secondary | ICD-10-CM | POA: Diagnosis not present

## 2022-10-22 DIAGNOSIS — R531 Weakness: Secondary | ICD-10-CM | POA: Diagnosis not present

## 2022-10-23 DIAGNOSIS — Z7409 Other reduced mobility: Secondary | ICD-10-CM | POA: Diagnosis not present

## 2022-10-23 DIAGNOSIS — R531 Weakness: Secondary | ICD-10-CM | POA: Diagnosis not present

## 2022-10-23 DIAGNOSIS — R5381 Other malaise: Secondary | ICD-10-CM | POA: Diagnosis not present

## 2022-10-23 DIAGNOSIS — R2681 Unsteadiness on feet: Secondary | ICD-10-CM | POA: Diagnosis not present

## 2022-10-23 DIAGNOSIS — D761 Hemophagocytic lymphohistiocytosis: Secondary | ICD-10-CM | POA: Diagnosis not present

## 2022-10-23 DIAGNOSIS — C9110 Chronic lymphocytic leukemia of B-cell type not having achieved remission: Secondary | ICD-10-CM | POA: Diagnosis not present

## 2022-10-28 DIAGNOSIS — R2681 Unsteadiness on feet: Secondary | ICD-10-CM | POA: Diagnosis not present

## 2022-10-28 DIAGNOSIS — R531 Weakness: Secondary | ICD-10-CM | POA: Diagnosis not present

## 2022-10-28 DIAGNOSIS — R5381 Other malaise: Secondary | ICD-10-CM | POA: Diagnosis not present

## 2022-10-28 DIAGNOSIS — Z7409 Other reduced mobility: Secondary | ICD-10-CM | POA: Diagnosis not present

## 2022-10-30 DIAGNOSIS — R5381 Other malaise: Secondary | ICD-10-CM | POA: Diagnosis not present

## 2022-10-30 DIAGNOSIS — R531 Weakness: Secondary | ICD-10-CM | POA: Diagnosis not present

## 2022-10-30 DIAGNOSIS — R2681 Unsteadiness on feet: Secondary | ICD-10-CM | POA: Diagnosis not present

## 2022-10-30 DIAGNOSIS — Z7409 Other reduced mobility: Secondary | ICD-10-CM | POA: Diagnosis not present

## 2022-11-05 DIAGNOSIS — K683 Retroperitoneal hematoma: Secondary | ICD-10-CM | POA: Diagnosis not present

## 2022-11-05 DIAGNOSIS — Z85828 Personal history of other malignant neoplasm of skin: Secondary | ICD-10-CM | POA: Diagnosis not present

## 2022-11-05 DIAGNOSIS — C9110 Chronic lymphocytic leukemia of B-cell type not having achieved remission: Secondary | ICD-10-CM | POA: Diagnosis not present

## 2022-11-06 DIAGNOSIS — R5381 Other malaise: Secondary | ICD-10-CM | POA: Diagnosis not present

## 2022-11-06 DIAGNOSIS — R2681 Unsteadiness on feet: Secondary | ICD-10-CM | POA: Diagnosis not present

## 2022-11-06 DIAGNOSIS — Z7409 Other reduced mobility: Secondary | ICD-10-CM | POA: Diagnosis not present

## 2022-11-06 DIAGNOSIS — R531 Weakness: Secondary | ICD-10-CM | POA: Diagnosis not present

## 2022-11-11 DIAGNOSIS — Z7409 Other reduced mobility: Secondary | ICD-10-CM | POA: Diagnosis not present

## 2022-11-11 DIAGNOSIS — R531 Weakness: Secondary | ICD-10-CM | POA: Diagnosis not present

## 2022-11-11 DIAGNOSIS — R5381 Other malaise: Secondary | ICD-10-CM | POA: Diagnosis not present

## 2022-11-11 DIAGNOSIS — R2681 Unsteadiness on feet: Secondary | ICD-10-CM | POA: Diagnosis not present

## 2022-11-13 DIAGNOSIS — R531 Weakness: Secondary | ICD-10-CM | POA: Diagnosis not present

## 2022-11-13 DIAGNOSIS — R2681 Unsteadiness on feet: Secondary | ICD-10-CM | POA: Diagnosis not present

## 2022-11-13 DIAGNOSIS — R5381 Other malaise: Secondary | ICD-10-CM | POA: Diagnosis not present

## 2022-11-13 DIAGNOSIS — Z7409 Other reduced mobility: Secondary | ICD-10-CM | POA: Diagnosis not present

## 2022-11-14 DIAGNOSIS — D709 Neutropenia, unspecified: Secondary | ICD-10-CM | POA: Diagnosis not present

## 2022-11-14 DIAGNOSIS — E039 Hypothyroidism, unspecified: Secondary | ICD-10-CM | POA: Diagnosis not present

## 2022-11-14 DIAGNOSIS — C9110 Chronic lymphocytic leukemia of B-cell type not having achieved remission: Secondary | ICD-10-CM | POA: Diagnosis not present

## 2022-11-18 DIAGNOSIS — R531 Weakness: Secondary | ICD-10-CM | POA: Diagnosis not present

## 2022-11-18 DIAGNOSIS — Z7409 Other reduced mobility: Secondary | ICD-10-CM | POA: Diagnosis not present

## 2022-11-18 DIAGNOSIS — R2681 Unsteadiness on feet: Secondary | ICD-10-CM | POA: Diagnosis not present

## 2022-11-18 DIAGNOSIS — R5381 Other malaise: Secondary | ICD-10-CM | POA: Diagnosis not present

## 2022-11-25 DIAGNOSIS — Z7409 Other reduced mobility: Secondary | ICD-10-CM | POA: Diagnosis not present

## 2022-11-25 DIAGNOSIS — R5381 Other malaise: Secondary | ICD-10-CM | POA: Diagnosis not present

## 2022-11-25 DIAGNOSIS — R531 Weakness: Secondary | ICD-10-CM | POA: Diagnosis not present

## 2022-11-25 DIAGNOSIS — R2681 Unsteadiness on feet: Secondary | ICD-10-CM | POA: Diagnosis not present

## 2022-12-03 DIAGNOSIS — R5381 Other malaise: Secondary | ICD-10-CM | POA: Diagnosis not present

## 2022-12-03 DIAGNOSIS — Z7409 Other reduced mobility: Secondary | ICD-10-CM | POA: Diagnosis not present

## 2022-12-03 DIAGNOSIS — R2681 Unsteadiness on feet: Secondary | ICD-10-CM | POA: Diagnosis not present

## 2022-12-03 DIAGNOSIS — R531 Weakness: Secondary | ICD-10-CM | POA: Diagnosis not present

## 2022-12-05 DIAGNOSIS — C9110 Chronic lymphocytic leukemia of B-cell type not having achieved remission: Secondary | ICD-10-CM | POA: Diagnosis not present

## 2022-12-09 DIAGNOSIS — Z7409 Other reduced mobility: Secondary | ICD-10-CM | POA: Diagnosis not present

## 2022-12-09 DIAGNOSIS — R531 Weakness: Secondary | ICD-10-CM | POA: Diagnosis not present

## 2022-12-09 DIAGNOSIS — R5381 Other malaise: Secondary | ICD-10-CM | POA: Diagnosis not present

## 2022-12-09 DIAGNOSIS — R2681 Unsteadiness on feet: Secondary | ICD-10-CM | POA: Diagnosis not present

## 2022-12-16 DIAGNOSIS — R2681 Unsteadiness on feet: Secondary | ICD-10-CM | POA: Diagnosis not present

## 2022-12-16 DIAGNOSIS — R531 Weakness: Secondary | ICD-10-CM | POA: Diagnosis not present

## 2022-12-16 DIAGNOSIS — R5381 Other malaise: Secondary | ICD-10-CM | POA: Diagnosis not present

## 2022-12-16 DIAGNOSIS — Z7409 Other reduced mobility: Secondary | ICD-10-CM | POA: Diagnosis not present

## 2022-12-23 DIAGNOSIS — R531 Weakness: Secondary | ICD-10-CM | POA: Diagnosis not present

## 2022-12-23 DIAGNOSIS — R2681 Unsteadiness on feet: Secondary | ICD-10-CM | POA: Diagnosis not present

## 2022-12-23 DIAGNOSIS — Z7409 Other reduced mobility: Secondary | ICD-10-CM | POA: Diagnosis not present

## 2022-12-23 DIAGNOSIS — R5381 Other malaise: Secondary | ICD-10-CM | POA: Diagnosis not present

## 2022-12-26 DIAGNOSIS — D849 Immunodeficiency, unspecified: Secondary | ICD-10-CM | POA: Diagnosis not present

## 2022-12-26 DIAGNOSIS — D709 Neutropenia, unspecified: Secondary | ICD-10-CM | POA: Diagnosis not present

## 2022-12-26 DIAGNOSIS — E039 Hypothyroidism, unspecified: Secondary | ICD-10-CM | POA: Diagnosis not present

## 2022-12-26 DIAGNOSIS — C9110 Chronic lymphocytic leukemia of B-cell type not having achieved remission: Secondary | ICD-10-CM | POA: Diagnosis not present

## 2022-12-26 DIAGNOSIS — D761 Hemophagocytic lymphohistiocytosis: Secondary | ICD-10-CM | POA: Diagnosis not present

## 2022-12-30 DIAGNOSIS — Z7409 Other reduced mobility: Secondary | ICD-10-CM | POA: Diagnosis not present

## 2022-12-30 DIAGNOSIS — R2681 Unsteadiness on feet: Secondary | ICD-10-CM | POA: Diagnosis not present

## 2022-12-30 DIAGNOSIS — R5381 Other malaise: Secondary | ICD-10-CM | POA: Diagnosis not present

## 2022-12-30 DIAGNOSIS — R531 Weakness: Secondary | ICD-10-CM | POA: Diagnosis not present

## 2023-01-01 DIAGNOSIS — I10 Essential (primary) hypertension: Secondary | ICD-10-CM | POA: Diagnosis not present

## 2023-01-01 DIAGNOSIS — E039 Hypothyroidism, unspecified: Secondary | ICD-10-CM | POA: Diagnosis not present

## 2023-01-01 DIAGNOSIS — G8929 Other chronic pain: Secondary | ICD-10-CM | POA: Diagnosis not present

## 2023-01-01 DIAGNOSIS — Z9229 Personal history of other drug therapy: Secondary | ICD-10-CM | POA: Diagnosis not present

## 2023-01-01 DIAGNOSIS — H6123 Impacted cerumen, bilateral: Secondary | ICD-10-CM | POA: Diagnosis not present

## 2023-01-01 DIAGNOSIS — C9110 Chronic lymphocytic leukemia of B-cell type not having achieved remission: Secondary | ICD-10-CM | POA: Diagnosis not present

## 2023-01-01 DIAGNOSIS — M545 Low back pain, unspecified: Secondary | ICD-10-CM | POA: Diagnosis not present

## 2023-01-01 DIAGNOSIS — Z85828 Personal history of other malignant neoplasm of skin: Secondary | ICD-10-CM | POA: Diagnosis not present

## 2023-01-01 DIAGNOSIS — L600 Ingrowing nail: Secondary | ICD-10-CM | POA: Diagnosis not present

## 2023-01-07 DIAGNOSIS — R5381 Other malaise: Secondary | ICD-10-CM | POA: Diagnosis not present

## 2023-01-07 DIAGNOSIS — R2681 Unsteadiness on feet: Secondary | ICD-10-CM | POA: Diagnosis not present

## 2023-01-07 DIAGNOSIS — Z7409 Other reduced mobility: Secondary | ICD-10-CM | POA: Diagnosis not present

## 2023-01-07 DIAGNOSIS — R531 Weakness: Secondary | ICD-10-CM | POA: Diagnosis not present

## 2023-01-21 DIAGNOSIS — L578 Other skin changes due to chronic exposure to nonionizing radiation: Secondary | ICD-10-CM | POA: Diagnosis not present

## 2023-01-21 DIAGNOSIS — L821 Other seborrheic keratosis: Secondary | ICD-10-CM | POA: Diagnosis not present

## 2023-01-21 DIAGNOSIS — L814 Other melanin hyperpigmentation: Secondary | ICD-10-CM | POA: Diagnosis not present

## 2023-01-21 DIAGNOSIS — Z85828 Personal history of other malignant neoplasm of skin: Secondary | ICD-10-CM | POA: Diagnosis not present

## 2023-01-21 DIAGNOSIS — L570 Actinic keratosis: Secondary | ICD-10-CM | POA: Diagnosis not present

## 2023-01-21 DIAGNOSIS — D229 Melanocytic nevi, unspecified: Secondary | ICD-10-CM | POA: Diagnosis not present

## 2023-02-12 DIAGNOSIS — E039 Hypothyroidism, unspecified: Secondary | ICD-10-CM | POA: Diagnosis not present

## 2023-02-25 DIAGNOSIS — U071 COVID-19: Secondary | ICD-10-CM | POA: Diagnosis not present

## 2023-02-25 DIAGNOSIS — R051 Acute cough: Secondary | ICD-10-CM | POA: Diagnosis not present

## 2023-02-25 DIAGNOSIS — E039 Hypothyroidism, unspecified: Secondary | ICD-10-CM | POA: Diagnosis not present

## 2023-02-25 DIAGNOSIS — D709 Neutropenia, unspecified: Secondary | ICD-10-CM | POA: Diagnosis not present

## 2023-02-25 DIAGNOSIS — D761 Hemophagocytic lymphohistiocytosis: Secondary | ICD-10-CM | POA: Diagnosis not present

## 2023-02-25 DIAGNOSIS — D849 Immunodeficiency, unspecified: Secondary | ICD-10-CM | POA: Diagnosis not present

## 2023-02-25 DIAGNOSIS — C9110 Chronic lymphocytic leukemia of B-cell type not having achieved remission: Secondary | ICD-10-CM | POA: Diagnosis not present

## 2023-02-27 DIAGNOSIS — C9110 Chronic lymphocytic leukemia of B-cell type not having achieved remission: Secondary | ICD-10-CM | POA: Diagnosis not present

## 2023-03-04 DIAGNOSIS — Z882 Allergy status to sulfonamides status: Secondary | ICD-10-CM | POA: Diagnosis not present

## 2023-03-04 DIAGNOSIS — R001 Bradycardia, unspecified: Secondary | ICD-10-CM | POA: Diagnosis not present

## 2023-03-04 DIAGNOSIS — M542 Cervicalgia: Secondary | ICD-10-CM | POA: Diagnosis not present

## 2023-03-04 DIAGNOSIS — Z86718 Personal history of other venous thrombosis and embolism: Secondary | ICD-10-CM | POA: Diagnosis not present

## 2023-03-04 DIAGNOSIS — Z5181 Encounter for therapeutic drug level monitoring: Secondary | ICD-10-CM | POA: Diagnosis not present

## 2023-03-04 DIAGNOSIS — E039 Hypothyroidism, unspecified: Secondary | ICD-10-CM | POA: Diagnosis not present

## 2023-03-04 DIAGNOSIS — I10 Essential (primary) hypertension: Secondary | ICD-10-CM | POA: Diagnosis not present
